# Patient Record
Sex: Male | Born: 1955 | Race: White | Hispanic: No | Marital: Married | Smoking: Never smoker
Health system: Southern US, Community
[De-identification: ages and names within clinical notes are randomized; demographics above are authoritative.]

## PROBLEM LIST (undated history)

## (undated) DIAGNOSIS — Z973 Presence of spectacles and contact lenses: Secondary | ICD-10-CM

## (undated) DIAGNOSIS — F419 Anxiety disorder, unspecified: Secondary | ICD-10-CM

## (undated) DIAGNOSIS — K4090 Unilateral inguinal hernia, without obstruction or gangrene, not specified as recurrent: Secondary | ICD-10-CM

## (undated) DIAGNOSIS — M1710 Unilateral primary osteoarthritis, unspecified knee: Secondary | ICD-10-CM

## (undated) DIAGNOSIS — E785 Hyperlipidemia, unspecified: Secondary | ICD-10-CM

## (undated) DIAGNOSIS — F909 Attention-deficit hyperactivity disorder, unspecified type: Secondary | ICD-10-CM

## (undated) DIAGNOSIS — W540XXA Bitten by dog, initial encounter: Secondary | ICD-10-CM

## (undated) DIAGNOSIS — N400 Enlarged prostate without lower urinary tract symptoms: Secondary | ICD-10-CM

## (undated) DIAGNOSIS — M179 Osteoarthritis of knee, unspecified: Secondary | ICD-10-CM

## (undated) HISTORY — PX: FRACTURE SURGERY: SHX138

---

## 2001-10-23 ENCOUNTER — Ambulatory Visit (HOSPITAL_COMMUNITY): Admission: RE | Admit: 2001-10-23 | Discharge: 2001-10-23 | Payer: Self-pay | Admitting: Orthopedic Surgery

## 2001-10-23 ENCOUNTER — Encounter: Payer: Self-pay | Admitting: Orthopedic Surgery

## 2001-10-24 ENCOUNTER — Encounter: Payer: Self-pay | Admitting: Orthopedic Surgery

## 2001-10-25 ENCOUNTER — Inpatient Hospital Stay (HOSPITAL_COMMUNITY): Admission: RE | Admit: 2001-10-25 | Discharge: 2001-10-27 | Payer: Self-pay | Admitting: Orthopedic Surgery

## 2001-10-25 ENCOUNTER — Encounter: Payer: Self-pay | Admitting: Orthopedic Surgery

## 2012-02-19 ENCOUNTER — Other Ambulatory Visit: Payer: Self-pay | Admitting: Neurosurgery

## 2012-02-19 DIAGNOSIS — M542 Cervicalgia: Secondary | ICD-10-CM

## 2012-02-27 ENCOUNTER — Other Ambulatory Visit: Payer: Self-pay

## 2012-03-12 ENCOUNTER — Ambulatory Visit
Admission: RE | Admit: 2012-03-12 | Discharge: 2012-03-12 | Disposition: A | Discharge status: Home / Self Care | Payer: BC Managed Care – PPO | Source: Ambulatory Visit | Attending: Neurosurgery | Admitting: Neurosurgery

## 2012-03-12 VITALS — BP 93/63 | HR 50

## 2012-03-12 DIAGNOSIS — M542 Cervicalgia: Secondary | ICD-10-CM

## 2012-03-12 MED ORDER — DIAZEPAM 5 MG PO TABS
10.0000 mg | ORAL_TABLET | Freq: Once | ORAL | Status: AC
  Administered 2012-03-12: 10 mg via ORAL

## 2012-03-12 MED ORDER — IOHEXOL 300 MG/ML  SOLN
10.0000 mL | Freq: Once | INTRAMUSCULAR | Status: AC | PRN
  Administered 2012-03-12: 10 mL via INTRAVENOUS

## 2012-06-27 ENCOUNTER — Other Ambulatory Visit: Payer: Self-pay | Admitting: Neurosurgery

## 2012-07-17 ENCOUNTER — Encounter (HOSPITAL_COMMUNITY): Payer: Self-pay | Admitting: Pharmacy Technician

## 2012-07-22 NOTE — Pre-Procedure Instructions (Addendum)
20 Christopher Ross  07/22/2012   Your procedure is scheduled on: Wednesday, November 20th   Report to [facility] Stay [facility] at 6:30 AM.  Call this number if you have problems the morning of surgery: [phone]   Remember:   Do not eat food or drink any liquids:After Midnight Tuesday.    Take these medicines the morning of surgery with A SIP OF WATER: None.     Discontinue aspirin, coumadin, plavix, effient and herbal medications.  Do not take any ibuprofen, naproxen, or aspirin productions.    Do not wear jewelry.  Do not wear lotions, powders, or colognes. You may NOT  wear deodorant.   Men may shave face and neck.   Do not bring valuables to the hospital.  Contacts, dentures or bridgework may not be worn into surgery.   Leave suitcase in the car. After surgery it may be brought to your room.  For patients admitted to the hospital, checkout time is 11:00 AM the day of discharge.   Patients discharged the day of surgery will not be allowed to drive home.   Name and phone number of your driver:    Special Instructions: Shower using CHG 2 nights before surgery and the night before surgery.  If you shower the day of surgery use CHG.  Use special wash - you have one bottle of CHG for all showers.  You should use approximately 1/3 of the bottle for each shower.   Please read over the following fact sheets that you were given: Pain Booklet, Coughing and Deep Breathing, MRSA Information and Surgical Site Infection Prevention

## 2012-07-23 ENCOUNTER — Encounter (HOSPITAL_COMMUNITY): Payer: Self-pay

## 2012-07-23 ENCOUNTER — Encounter (HOSPITAL_COMMUNITY)
Admission: RE | Admit: 2012-07-23 | Discharge: 2012-07-23 | Disposition: A | Discharge status: Home / Self Care | Payer: BC Managed Care – PPO | Source: Ambulatory Visit | Attending: Neurosurgery | Admitting: Neurosurgery

## 2012-07-23 HISTORY — DX: Hyperlipidemia, unspecified: E78.5

## 2012-07-23 LAB — BASIC METABOLIC PANEL
CO2: 29 mEq/L (ref 19–32)
Calcium: 9.6 mg/dL (ref 8.4–10.5)
Chloride: 103 mEq/L (ref 96–112)
Glucose, Bld: 102 mg/dL — ABNORMAL HIGH (ref 70–99)
Potassium: 4.6 mEq/L (ref 3.5–5.1)
Sodium: 141 mEq/L (ref 135–145)

## 2012-07-23 LAB — CBC
Hemoglobin: 14.4 g/dL (ref 13.0–17.0)
MCH: 30.8 pg (ref 26.0–34.0)
RBC: 4.67 MIL/uL (ref 4.22–5.81)

## 2012-07-23 LAB — SURGICAL PCR SCREEN: Staphylococcus aureus: NEGATIVE

## 2012-07-29 MED ORDER — CEFAZOLIN SODIUM-DEXTROSE 2-3 GM-% IV SOLR
2.0000 g | INTRAVENOUS | Status: AC
  Administered 2012-07-30: 2 g via INTRAVENOUS
  Filled 2012-07-29 (×2): qty 50

## 2012-07-30 ENCOUNTER — Ambulatory Visit (HOSPITAL_COMMUNITY): Payer: BC Managed Care – PPO

## 2012-07-30 ENCOUNTER — Encounter (HOSPITAL_COMMUNITY): Payer: Self-pay | Admitting: Anesthesiology

## 2012-07-30 ENCOUNTER — Encounter (HOSPITAL_COMMUNITY)
Admission: RE | Disposition: A | Discharge status: Home / Self Care | Payer: Self-pay | Source: Ambulatory Visit | Attending: Neurosurgery

## 2012-07-30 ENCOUNTER — Ambulatory Visit (HOSPITAL_COMMUNITY)
Admission: RE | Admit: 2012-07-30 | Discharge: 2012-07-31 | Disposition: A | Discharge status: Home / Self Care | Payer: BC Managed Care – PPO | Source: Ambulatory Visit | Attending: Neurosurgery | Admitting: Neurosurgery

## 2012-07-30 ENCOUNTER — Ambulatory Visit (HOSPITAL_COMMUNITY): Payer: BC Managed Care – PPO | Admitting: Anesthesiology

## 2012-07-30 ENCOUNTER — Encounter (HOSPITAL_COMMUNITY): Payer: Self-pay | Admitting: *Deleted

## 2012-07-30 DIAGNOSIS — M4722 Other spondylosis with radiculopathy, cervical region: Secondary | ICD-10-CM

## 2012-07-30 DIAGNOSIS — Z79899 Other long term (current) drug therapy: Secondary | ICD-10-CM | POA: Insufficient documentation

## 2012-07-30 DIAGNOSIS — M4712 Other spondylosis with myelopathy, cervical region: Secondary | ICD-10-CM | POA: Insufficient documentation

## 2012-07-30 DIAGNOSIS — Z01812 Encounter for preprocedural laboratory examination: Secondary | ICD-10-CM | POA: Insufficient documentation

## 2012-07-30 DIAGNOSIS — E785 Hyperlipidemia, unspecified: Secondary | ICD-10-CM | POA: Insufficient documentation

## 2012-07-30 DIAGNOSIS — M5000 Cervical disc disorder with myelopathy, unspecified cervical region: Principal | ICD-10-CM | POA: Insufficient documentation

## 2012-07-30 HISTORY — PX: ANTERIOR CERVICAL DECOMP/DISCECTOMY FUSION: SHX1161

## 2012-07-30 SURGERY — ANTERIOR CERVICAL DECOMPRESSION/DISCECTOMY FUSION 2 LEVELS
Anesthesia: General | Site: Spine Cervical | Wound class: Clean

## 2012-07-30 MED ORDER — VECURONIUM BROMIDE 10 MG IV SOLR
INTRAVENOUS | Status: DC | PRN
  Administered 2012-07-30: 2 mg via INTRAVENOUS
  Administered 2012-07-30 (×3): 1 mg via INTRAVENOUS

## 2012-07-30 MED ORDER — ZOLPIDEM TARTRATE 5 MG PO TABS
5.0000 mg | ORAL_TABLET | Freq: Every evening | ORAL | Status: DC | PRN
  Administered 2012-07-30: 5 mg via ORAL
  Filled 2012-07-30: qty 1

## 2012-07-30 MED ORDER — ACETAMINOPHEN 10 MG/ML IV SOLN
INTRAVENOUS | Status: AC
  Administered 2012-07-30: 1000 mg via INTRAVENOUS
  Filled 2012-07-30: qty 100

## 2012-07-30 MED ORDER — OXYCODONE HCL 5 MG PO TABS: 5.0000 mg | ORAL_TABLET | Freq: Once | ORAL | Status: DC | PRN

## 2012-07-30 MED ORDER — DEXAMETHASONE SODIUM PHOSPHATE 4 MG/ML IJ SOLN: 4.0000 mg | Freq: Four times a day (QID) | INTRAMUSCULAR | Status: AC

## 2012-07-30 MED ORDER — MEPERIDINE HCL 25 MG/ML IJ SOLN
6.2500 mg | INTRAMUSCULAR | Status: DC | PRN
Start: 2012-07-30 — End: 2012-07-30

## 2012-07-30 MED ORDER — THROMBIN 5000 UNITS EX KIT
PACK | CUTANEOUS | Status: DC | PRN
  Administered 2012-07-30 (×4): 5000 [IU] via TOPICAL

## 2012-07-30 MED ORDER — BACITRACIN ZINC 500 UNIT/GM EX OINT
TOPICAL_OINTMENT | CUTANEOUS | Status: DC | PRN
  Administered 2012-07-30: 1 via TOPICAL

## 2012-07-30 MED ORDER — ACETAMINOPHEN 325 MG PO TABS: 650.0000 mg | ORAL_TABLET | ORAL | Status: DC | PRN

## 2012-07-30 MED ORDER — LIDOCAINE HCL 4 % MT SOLN
OROMUCOSAL | Status: DC | PRN
  Administered 2012-07-30: 4 mL via TOPICAL

## 2012-07-30 MED ORDER — GLYCOPYRROLATE 0.2 MG/ML IJ SOLN
INTRAMUSCULAR | Status: DC | PRN
  Administered 2012-07-30: .6 mg via INTRAVENOUS

## 2012-07-30 MED ORDER — ARTIFICIAL TEARS OP OINT
TOPICAL_OINTMENT | OPHTHALMIC | Status: DC | PRN
  Administered 2012-07-30: 1 via OPHTHALMIC

## 2012-07-30 MED ORDER — HEMOSTATIC AGENTS (NO CHARGE) OPTIME
TOPICAL | Status: DC | PRN
  Administered 2012-07-30 (×2): 1 via TOPICAL

## 2012-07-30 MED ORDER — ONDANSETRON HCL 4 MG/2ML IJ SOLN: 4.0000 mg | INTRAMUSCULAR | Status: DC | PRN

## 2012-07-30 MED ORDER — BACITRACIN 50000 UNITS IM SOLR
INTRAMUSCULAR | Status: AC
  Filled 2012-07-30: qty 1

## 2012-07-30 MED ORDER — OXYCODONE HCL 5 MG/5ML PO SOLN: 5.0000 mg | Freq: Once | ORAL | Status: DC | PRN

## 2012-07-30 MED ORDER — MENTHOL 3 MG MT LOZG
1.0000 | LOZENGE | OROMUCOSAL | Status: DC | PRN
  Administered 2012-07-30: 3 mg via ORAL
  Filled 2012-07-30 (×2): qty 9

## 2012-07-30 MED ORDER — DIAZEPAM 5 MG PO TABS: 5.0000 mg | ORAL_TABLET | Freq: Four times a day (QID) | ORAL | Status: DC | PRN

## 2012-07-30 MED ORDER — LIDOCAINE HCL (CARDIAC) 20 MG/ML IV SOLN
INTRAVENOUS | Status: DC | PRN
  Administered 2012-07-30: 100 mg via INTRAVENOUS

## 2012-07-30 MED ORDER — DEXAMETHASONE 4 MG PO TABS
4.0000 mg | ORAL_TABLET | Freq: Four times a day (QID) | ORAL | Status: AC
  Administered 2012-07-30 – 2012-07-31 (×2): 4 mg via ORAL
  Filled 2012-07-30 (×2): qty 1

## 2012-07-30 MED ORDER — PHENYLEPHRINE HCL 10 MG/ML IJ SOLN
10.0000 mg | INTRAVENOUS | Status: DC | PRN
  Administered 2012-07-30: 10 ug/min via INTRAVENOUS

## 2012-07-30 MED ORDER — ONDANSETRON HCL 4 MG/2ML IJ SOLN: 4.0000 mg | Freq: Once | INTRAMUSCULAR | Status: DC | PRN

## 2012-07-30 MED ORDER — HYDROCODONE-ACETAMINOPHEN 5-325 MG PO TABS
1.0000 | ORAL_TABLET | ORAL | Status: DC | PRN
  Administered 2012-07-31: 1 via ORAL
  Filled 2012-07-30: qty 1

## 2012-07-30 MED ORDER — MIDAZOLAM HCL 5 MG/5ML IJ SOLN
INTRAMUSCULAR | Status: DC | PRN
  Administered 2012-07-30: 2 mg via INTRAVENOUS

## 2012-07-30 MED ORDER — MORPHINE SULFATE 2 MG/ML IJ SOLN: 1.0000 mg | INTRAMUSCULAR | Status: DC | PRN

## 2012-07-30 MED ORDER — 0.9 % SODIUM CHLORIDE (POUR BTL) OPTIME
TOPICAL | Status: DC | PRN
  Administered 2012-07-30: 1000 mL

## 2012-07-30 MED ORDER — HYDROMORPHONE HCL PF 1 MG/ML IJ SOLN
0.2500 mg | INTRAMUSCULAR | Status: DC | PRN
  Administered 2012-07-30: 0.5 mg via INTRAVENOUS

## 2012-07-30 MED ORDER — SODIUM CHLORIDE 0.9 % IR SOLN
Status: DC | PRN
  Administered 2012-07-30: 09:00:00

## 2012-07-30 MED ORDER — OXYCODONE-ACETAMINOPHEN 5-325 MG PO TABS: 1.0000 | ORAL_TABLET | ORAL | Status: DC | PRN

## 2012-07-30 MED ORDER — EPHEDRINE SULFATE 50 MG/ML IJ SOLN
INTRAMUSCULAR | Status: DC | PRN
  Administered 2012-07-30: 5 mg via INTRAVENOUS

## 2012-07-30 MED ORDER — CEFAZOLIN SODIUM-DEXTROSE 2-3 GM-% IV SOLR
2.0000 g | Freq: Three times a day (TID) | INTRAVENOUS | Status: AC
  Administered 2012-07-30 – 2012-07-31 (×2): 2 g via INTRAVENOUS
  Filled 2012-07-30 (×2): qty 50

## 2012-07-30 MED ORDER — ROCURONIUM BROMIDE 100 MG/10ML IV SOLN
INTRAVENOUS | Status: DC | PRN
  Administered 2012-07-30: 50 mg via INTRAVENOUS

## 2012-07-30 MED ORDER — SIMVASTATIN 5 MG PO TABS
2.5000 mg | ORAL_TABLET | Freq: Every day | ORAL | Status: DC
  Administered 2012-07-30: 2.5 mg via ORAL
  Filled 2012-07-30 (×2): qty 1

## 2012-07-30 MED ORDER — ACETAMINOPHEN 650 MG RE SUPP: 650.0000 mg | RECTAL | Status: DC | PRN

## 2012-07-30 MED ORDER — PROPOFOL 10 MG/ML IV BOLUS
INTRAVENOUS | Status: DC | PRN
  Administered 2012-07-30: 180 mg via INTRAVENOUS

## 2012-07-30 MED ORDER — BUPIVACAINE-EPINEPHRINE PF 0.5-1:200000 % IJ SOLN
INTRAMUSCULAR | Status: DC | PRN
  Administered 2012-07-30: 10 mL

## 2012-07-30 MED ORDER — PHENOL 1.4 % MT LIQD
1.0000 | OROMUCOSAL | Status: DC | PRN
  Filled 2012-07-30: qty 177

## 2012-07-30 MED ORDER — LACTATED RINGERS IV SOLN
INTRAVENOUS | Status: DC | PRN
  Administered 2012-07-30 (×2): via INTRAVENOUS

## 2012-07-30 MED ORDER — DEXAMETHASONE SODIUM PHOSPHATE 4 MG/ML IJ SOLN
8.0000 mg | Freq: Once | INTRAMUSCULAR | Status: AC
  Administered 2012-07-30: 8 mg via INTRAVENOUS
  Filled 2012-07-30: qty 2

## 2012-07-30 MED ORDER — SODIUM CHLORIDE 0.9 % IV SOLN
INTRAVENOUS | Status: AC
  Filled 2012-07-30: qty 500

## 2012-07-30 MED ORDER — ONDANSETRON HCL 4 MG/2ML IJ SOLN
INTRAMUSCULAR | Status: DC | PRN
  Administered 2012-07-30: 4 mg via INTRAVENOUS

## 2012-07-30 MED ORDER — NEOSTIGMINE METHYLSULFATE 1 MG/ML IJ SOLN
INTRAMUSCULAR | Status: DC | PRN
  Administered 2012-07-30: 4 mg via INTRAVENOUS

## 2012-07-30 MED ORDER — FENTANYL CITRATE 0.05 MG/ML IJ SOLN
INTRAMUSCULAR | Status: DC | PRN
  Administered 2012-07-30 (×5): 50 ug via INTRAVENOUS

## 2012-07-30 MED ORDER — LACTATED RINGERS IV SOLN: INTRAVENOUS | Status: DC

## 2012-07-30 MED ORDER — PHENYLEPHRINE HCL 10 MG/ML IJ SOLN
INTRAMUSCULAR | Status: DC | PRN
  Administered 2012-07-30: 80 ug via INTRAVENOUS
  Administered 2012-07-30: 40 ug via INTRAVENOUS

## 2012-07-30 MED ORDER — HYDROMORPHONE HCL PF 1 MG/ML IJ SOLN
INTRAMUSCULAR | Status: AC
  Filled 2012-07-30: qty 1

## 2012-07-30 MED ORDER — DOCUSATE SODIUM 100 MG PO CAPS
100.0000 mg | ORAL_CAPSULE | Freq: Two times a day (BID) | ORAL | Status: DC
  Administered 2012-07-30: 100 mg via ORAL
  Filled 2012-07-30: qty 1

## 2012-07-30 SURGICAL SUPPLY — 61 items
APL SKNCLS STERI-STRIP NONHPOA (GAUZE/BANDAGES/DRESSINGS) ×1
BAG DECANTER FOR FLEXI CONT (MISCELLANEOUS) ×2 IMPLANT
BENZOIN TINCTURE PRP APPL 2/3 (GAUZE/BANDAGES/DRESSINGS) ×3 IMPLANT
BLADE SURG 15 STRL LF DISP TIS (BLADE) ×1 IMPLANT
BLADE SURG 15 STRL SS (BLADE) ×2
BLADE ULTRA TIP 2M (BLADE) ×2 IMPLANT
BRUSH SCRUB EZ PLAIN DRY (MISCELLANEOUS) ×2 IMPLANT
BUR BARREL STRAIGHT FLUTE 4.0 (BURR) ×2 IMPLANT
BUR MATCHSTICK NEURO 3.0 LAGG (BURR) ×2 IMPLANT
CANISTER SUCTION 2500CC (MISCELLANEOUS) ×2 IMPLANT
CLOTH BEACON ORANGE TIMEOUT ST (SAFETY) ×2 IMPLANT
CONT SPEC 4OZ CLIKSEAL STRL BL (MISCELLANEOUS) ×2 IMPLANT
COVER MAYO STAND STRL (DRAPES) ×2 IMPLANT
DRAPE LAPAROTOMY 100X72 PEDS (DRAPES) ×2 IMPLANT
DRAPE MICROSCOPE LEICA (MISCELLANEOUS) IMPLANT
DRAPE POUCH INSTRU U-SHP 10X18 (DRAPES) ×2 IMPLANT
DRAPE SURG 17X23 STRL (DRAPES) ×4 IMPLANT
ELECT REM PT RETURN 9FT ADLT (ELECTROSURGICAL) ×2
ELECTRODE REM PT RTRN 9FT ADLT (ELECTROSURGICAL) ×1 IMPLANT
GAUZE SPONGE 4X4 16PLY XRAY LF (GAUZE/BANDAGES/DRESSINGS) IMPLANT
GLOVE BIO SURGEON STRL SZ8.5 (GLOVE) ×2 IMPLANT
GLOVE BIOGEL PI IND STRL 8.5 (GLOVE) IMPLANT
GLOVE BIOGEL PI INDICATOR 8.5 (GLOVE) ×2
GLOVE ECLIPSE 6.5 STRL STRAW (GLOVE) ×1 IMPLANT
GLOVE EXAM NITRILE LRG STRL (GLOVE) IMPLANT
GLOVE EXAM NITRILE MD LF STRL (GLOVE) IMPLANT
GLOVE EXAM NITRILE XL STR (GLOVE) IMPLANT
GLOVE EXAM NITRILE XS STR PU (GLOVE) IMPLANT
GLOVE SS BIOGEL STRL SZ 8 (GLOVE) ×1 IMPLANT
GLOVE SUPERSENSE BIOGEL SZ 8 (GLOVE) ×1
GLOVE SURG SS PI 8.0 STRL IVOR (GLOVE) ×3 IMPLANT
GOWN BRE IMP SLV AUR LG STRL (GOWN DISPOSABLE) ×1 IMPLANT
GOWN BRE IMP SLV AUR XL STRL (GOWN DISPOSABLE) ×1 IMPLANT
GOWN STRL REIN 2XL LVL4 (GOWN DISPOSABLE) ×1 IMPLANT
KIT BASIN OR (CUSTOM PROCEDURE TRAY) ×2 IMPLANT
KIT ROOM TURNOVER OR (KITS) ×2 IMPLANT
MARKER SKIN DUAL TIP RULER LAB (MISCELLANEOUS) ×2 IMPLANT
NDL SPNL 18GX3.5 QUINCKE PK (NEEDLE) ×1 IMPLANT
NEEDLE HYPO 22GX1.5 SAFETY (NEEDLE) ×2 IMPLANT
NEEDLE SPNL 18GX3.5 QUINCKE PK (NEEDLE) ×2 IMPLANT
NS IRRIG 1000ML POUR BTL (IV SOLUTION) ×2 IMPLANT
PACK LAMINECTOMY NEURO (CUSTOM PROCEDURE TRAY) ×2 IMPLANT
PATTIES SURGICAL .5 X.5 (GAUZE/BANDAGES/DRESSINGS) ×1 IMPLANT
PEEK VISTA 14X14X7MM (Peek) ×2 IMPLANT
PIN DISTRACTION 14MM (PIN) ×4 IMPLANT
PLATE ANT CERV XTEND 2 LV 32 (Plate) ×1 IMPLANT
PUTTY 5ML ACTIFUSE ABX (Putty) ×1 IMPLANT
RUBBERBAND STERILE (MISCELLANEOUS) IMPLANT
SCREW XTD VAR 4.2 SELF TAP (Screw) ×6 IMPLANT
SPONGE GAUZE 4X4 12PLY (GAUZE/BANDAGES/DRESSINGS) ×2 IMPLANT
SPONGE INTESTINAL PEANUT (DISPOSABLE) ×4 IMPLANT
SPONGE SURGIFOAM ABS GEL SZ50 (HEMOSTASIS) ×3 IMPLANT
STRIP CLOSURE SKIN 1/2X4 (GAUZE/BANDAGES/DRESSINGS) ×2 IMPLANT
SUT VIC AB 0 CT1 27 (SUTURE) ×2
SUT VIC AB 0 CT1 27XBRD ANTBC (SUTURE) ×1 IMPLANT
SUT VIC AB 3-0 SH 8-18 (SUTURE) ×3 IMPLANT
SYR 20ML ECCENTRIC (SYRINGE) ×2 IMPLANT
TAPE CLOTH SURG 4X10 WHT LF (GAUZE/BANDAGES/DRESSINGS) ×1 IMPLANT
TOWEL OR 17X24 6PK STRL BLUE (TOWEL DISPOSABLE) ×2 IMPLANT
TOWEL OR 17X26 10 PK STRL BLUE (TOWEL DISPOSABLE) ×2 IMPLANT
WATER STERILE IRR 1000ML POUR (IV SOLUTION) ×2 IMPLANT

## 2012-07-30 NOTE — Progress Notes (Signed)
Patient ID: Christopher Ross, male   DOB: 1955-10-24, 56 y.o.   MRN: [id number] Subjective:  The patient is alert and pleasant. He looks well. He is in no apparent distress.  Objective: Vital signs in last 24 hours: Temp:  [97.5 F (36.4 C)-97.7 F (36.5 C)] 97.7 F (36.5 C) (11/20 1237) Pulse Rate:  [58-87] 73  (11/20 1237) Resp:  [10-51] 16  (11/20 1237) BP: (105-129)/(60-75) 129/69 mmHg (11/20 1237) SpO2:  [94 %-98 %] 94 % (11/20 1237)  Intake/Output from previous day:   Intake/Output this shift: Total I/O In: 1600 [I.V.:1600] Out: 150 [Blood:150]  Physical exam the patient is alert and oriented. His strength is normal his bilateral deltoid bicep and lower extremities. His dressing is clean and dry. There is no evidence of hematoma or shift.  Lab Results: No results found for this basename: WBC:2,HGB:2,HCT:2,PLT:2 in the last 72 hours BMET No results found for this basename: NA:2,K:2,CL:2,CO2:2,GLUCOSE:2,BUN:2,CREATININE:2,CALCIUM:2 in the last 72 hours  Studies/Results: Dg Cervical Spine 2-3 Views  07/30/2012  *RADIOLOGY REPORT*  Clinical Data: Neck pain.  CERVICAL SPINE - 2-3 VIEW  Comparison: Multiple priors.  Findings: Film #1 demonstrates a needle at C5-C6.  Film #2 demonstrates plate and screws status post C4-C6 ACDF.  Satisfactory position and alignment.  IMPRESSION: As above.   Original Report Authenticated By: Davonna Belling, M.D.     Assessment/Plan: The patient is doing well.  LOS: 0 days     Perseus Westall D 07/30/2012, 3:41 PM

## 2012-07-30 NOTE — Anesthesia Preprocedure Evaluation (Addendum)
Anesthesia Evaluation  Patient identified by MRN, date of birth, ID band Patient awake    Reviewed: Allergy & Precautions, H&P , NPO status , Patient's Chart, lab work & pertinent test results, reviewed documented beta blocker date and time   Airway Mallampati: II TM Distance: >3 FB Neck ROM: Full    Dental  (+) Teeth Intact and Dental Advisory Given   Pulmonary          Cardiovascular Rhythm:Regular     Neuro/Psych    GI/Hepatic   Endo/Other    Renal/GU      Musculoskeletal   Abdominal   Peds  Hematology   Anesthesia Other Findings   Reproductive/Obstetrics                           Anesthesia Physical Anesthesia Plan  ASA: II  Anesthesia Plan: General   Post-op Pain Management:    Induction: Intravenous  Airway Management Planned: Oral ETT  Additional Equipment:   Intra-op Plan:   Post-operative Plan: Extubation in OR  Informed Consent: I have reviewed the patients History and Physical, chart, labs and discussed the procedure including the risks, benefits and alternatives for the proposed anesthesia with the patient or authorized representative who has indicated his/her understanding and acceptance.   Dental advisory given  Plan Discussed with: CRNA and Surgeon  Anesthesia Plan Comments:        Anesthesia Quick Evaluation

## 2012-07-30 NOTE — Transfer of Care (Signed)
Immediate Anesthesia Transfer of Care Note  Patient: Christopher Ross  Procedure(s) Performed: Procedure(s) (LRB) with comments: ANTERIOR CERVICAL DECOMPRESSION/DISCECTOMY FUSION 2 LEVELS (N/A) - Cervical four-five,Cervical five-six anterior cervical decompression with fusion interbody prothesis plating and bonegraft  Patient Location: PACU  Anesthesia Type:General  Level of Consciousness: awake, alert  and oriented  Airway & Oxygen Therapy: Patient Spontanous Breathing and Patient connected to nasal cannula oxygen  Post-op Assessment: Report given to PACU RN, Post -op Vital signs reviewed and stable and Patient moving all extremities  Post vital signs: Reviewed and stable  Complications: No apparent anesthesia complications

## 2012-07-30 NOTE — Progress Notes (Signed)
Patient reports that after application of CHF wipes that he started to itch around his neck and that he scratched his neck some. Patient reports that same in now resolved. Neck cleaned with water. Area noted to be red. Hives noted. Patient denies other symptoms. Neuro OR desk notified. Patient told to call for further problems.

## 2012-07-30 NOTE — Anesthesia Postprocedure Evaluation (Signed)
Anesthesia Post Note  Patient: Christopher Ross  Procedure(s) Performed: Procedure(s) (LRB): ANTERIOR CERVICAL DECOMPRESSION/DISCECTOMY FUSION 2 LEVELS (N/A)  Anesthesia type: general  Patient location: PACU  Post pain: Pain level controlled  Post assessment: Patient's Cardiovascular Status Stable  Last Vitals:  Filed Vitals:   07/30/12 1207  BP: 109/64  Pulse: 58  Temp:   Resp: 10    Post vital signs: Reviewed and stable  Level of consciousness: sedated  Complications: No apparent anesthesia complications

## 2012-07-30 NOTE — Progress Notes (Signed)
Patient ID: Christopher Ross, male   DOB: 06-10-1956, 56 y.o.   MRN: [id number] Subjective:  The patient is alert and pleasant. He looks well.  Objective: Vital signs in last 24 hours: Temp:  [97.5 F (36.4 C)-97.6 F (36.4 C)] 97.6 F (36.4 C) (11/20 1135) Pulse Rate:  [62-83] 76  (11/20 1200) Resp:  [11-18] 18  (11/20 1200) BP: (113-123)/(64-75) 113/64 mmHg (11/20 1135) SpO2:  [94 %-98 %] 98 % (11/20 1200)  Intake/Output from previous day:   Intake/Output this shift: Total I/O In: 1500 [I.V.:1500] Out: 150 [Blood:150]  Physical exam the patient is alert and oriented. He is moving all 4 extremities well. He is 5 over 5 bilateral deltoid strength. His dressing is clean and dry. There is no evidence of hematoma or shift.  Lab Results: No results found for this basename: WBC:2,HGB:2,HCT:2,PLT:2 in the last 72 hours BMET No results found for this basename: NA:2,K:2,CL:2,CO2:2,GLUCOSE:2,BUN:2,CREATININE:2,CALCIUM:2 in the last 72 hours  Studies/Results: Dg Cervical Spine 2-3 Views  07/30/2012  *RADIOLOGY REPORT*  Clinical Data: Neck pain.  CERVICAL SPINE - 2-3 VIEW  Comparison: Multiple priors.  Findings: Film #1 demonstrates a needle at C5-C6.  Film #2 demonstrates plate and screws status post C4-C6 ACDF.  Satisfactory position and alignment.  IMPRESSION: As above.   Original Report Authenticated By: Davonna Belling, M.D.     Assessment/Plan: The patient is doing well.  LOS: 0 days     Caylin Nass D 07/30/2012, 12:03 PM

## 2012-07-30 NOTE — H&P (Signed)
Subjective: The patient is a 56 year old white male who complains of neck and arm pain. He has failed medical management was worked up with a cervical MRI and cervical mild CT. This demonstrated spondylosis and stenosis at C4-5 and C5-6. We discussed the various treatment options including surgery. The patient has weighed the risks, benefits, and alternatives surgery and decided proceed with C4-5 and C5-6 anterior cervicectomy fusion and plating .  Past Medical History  Diagnosis Date  . Hyperlipidemia     Past Surgical History  Procedure Date  . Fracture surgery     broken left arm, repair    No Known Allergies  History  Substance Use Topics  . Smoking status: Never Smoker   . Smokeless tobacco: Never Used  . Alcohol Use: Yes     Comment: occasional     History reviewed. No pertinent family history. Prior to Admission medications   Medication Sig Start Date End Date Taking? Authorizing Provider  fish oil-omega-3 fatty acids 1000 MG capsule Take 1 g by mouth 4 (four) times daily.   Yes Historical Provider, MD  simvastatin (ZOCOR) 5 MG tablet Take 2.5 mg by mouth at bedtime.   Yes Historical Provider, MD  zolpidem (AMBIEN) 5 MG tablet Take 2.5 mg by mouth at bedtime.   Yes Historical Provider, MD     Review of Systems  Positive ROS:  As above  All other systems have been reviewed and were otherwise negative with the exception of those mentioned in the HPI and as above.  Objective: Vital signs in last 24 hours: Temp:  [97.5 F (36.4 C)] 97.5 F (36.4 C) (11/20 0646) Pulse Rate:  [62] 62  (11/20 0646) Resp:  [18] 18  (11/20 0646) BP: (123)/(75) 123/75 mmHg (11/20 0646) SpO2:  [94 %] 94 % (11/20 0646)  General Appearance: Alert, cooperative, no distress, appears stated age Head: Normocephalic, without obvious abnormality, atraumatic Eyes: PERRL, conjunctiva/corneas clear, EOM's intact, fundi benign, both eyes      Ears: Normal TM's and external ear canals, both  ears Throat: Lips, mucosa, and tongue normal; teeth and gums normal Neck: Supple, symmetrical, trachea midline, no adenopathy; thyroid: No enlargement/tenderness/nodules; no carotid bruit or JVD Back: Symmetric, no curvature, ROM normal, no CVA tenderness Lungs: Clear to auscultation bilaterally, respirations unlabored Heart: Regular rate and rhythm, S1 and S2 normal, no murmur, rub or gallop Abdomen: Soft, non-tender, bowel sounds active all four quadrants, no masses, no organomegaly Extremities: Extremities normal, atraumatic, no cyanosis or edema Pulses: 2+ and symmetric all extremities Skin: Skin color, texture, turgor normal, no rashes or lesions  NEUROLOGIC:   Mental status: alert and oriented, no aphasia, good attention span, Fund of knowledge/ memory ok Motor Exam - grossly normal Sensory Exam - grossly normal Reflexes:  Coordination - grossly normal Gait - grossly normal Balance - grossly normal Cranial Nerves: I: smell Not tested  II: visual acuity  OS: Normal    OD: Normal   II: visual fields Full to confrontation  II: pupils Equal, round, reactive to light  III,VII: ptosis None  III,IV,VI: extraocular muscles  Full ROM  V: mastication Normal  V: facial light touch sensation  Normal  V,VII: corneal reflex  Present  VII: facial muscle function - upper  Normal  VII: facial muscle function - lower Normal  VIII: hearing Not tested  IX: soft palate elevation  Normal  IX,X: gag reflex Present  XI: trapezius strength  5/5  XI: sternocleidomastoid strength 5/5  XI: neck flexion strength  5/5  XII: tongue strength  Normal    Data Review Lab Results  Component Value Date   WBC 9.0 07/23/2012   HGB 14.4 07/23/2012   HCT 41.9 07/23/2012   MCV 89.7 07/23/2012   PLT 187 07/23/2012   Lab Results  Component Value Date   NA 141 07/23/2012   K 4.6 07/23/2012   CL 103 07/23/2012   CO2 29 07/23/2012   BUN 18 07/23/2012   CREATININE 1.04 07/23/2012   GLUCOSE 102*  07/23/2012   No results found for this basename: INR, PROTIME    Assessment/Plan: 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 I reviewed his imaging studies with them and pointed out the abnormalities. We have discussed the various treatment options including surgery. I described the surgical option of a C4-5 and C5-6 anterior cervicectomy, fusion, and plating. I described the surgery to them. I've shown him surgical models. We have discussed the risks, benefits, alternatives, and likelihood of achieving our goals with surgery. I've answered all the patient's questions. He was to proceed with surgery.   Javien Tesch D 07/30/2012 8:29 AM

## 2012-07-30 NOTE — Op Note (Signed)
Brief history: The patient is a 56 year old white male complains of chronic neck shoulder and arm pain consistent with a cervical radiculopathy. He has failed medical management and was worked up with a cervical MRI, cervical myelo CT and NCV EMGs. The patient has multilevel spondylosis most prominent at C4-5 and C5-6. We discussed the various treatment options including a 2 level intracervical discectomy fusion and plating. The patient has weighed the risks, benefits, and alternatives surgery and decided proceed with the operation.  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 Delma Officer  Asst.: Dr. Barbaraann Barthel  Anesthesia: Gen. endotracheal  Estimated blood loss: 125 cc  Drains: None  Complications: None  Description of procedure: The patient was brought to the operating room by the anesthesia team. General endotracheal anesthesia was induced. A roll was placed under the patient's shoulders to keep the neck in the neutral position. The patient's anterior cervical region was then prepared with Betadine scrub and Betadine solution. Sterile drapes were applied.  The area to be incised was then injected with Marcaine with epinephrine solution. I then used a scalpel to make a transverse incision in the patient's left anterior neck. I used the Metzenbaum scissors to divide the platysmal muscle and then to dissect medial to the sternocleidomastoid muscle, jugular vein, and carotid artery. I carefully dissected down towards the anterior cervical  spine identifying the esophagus and retracting it medially. Then using Kitner swabs to clear soft tissue from the anterior cervical spine. We then inserted a bent spinal needle into the upper exposed intervertebral disc space. We then obtained intraoperative radiographs confirm our location.  I then used electrocautery to detach the medial border of the longus colli muscle bilaterally from the C4-5 and C5-6 intervertebral disc spaces. I then inserted the Caspar self-retaining retractor underneath the longus colli muscle bilaterally to provide exposure.  We then incised the intervertebral disc at C4-5. We then performed a partial intervertebral discectomy with a pituitary forceps and the Karlin curettes. I then inserted distraction screws into the vertebral bodies at C4 and C5. We then distracted the interspace. We then used the high-speed drill to decorticate the vertebral endplates at C4-5, to drill away the remainder of the intervertebral disc, to drill away some posterior spondylosis, and to thin out the posterior longitudinal ligament. I then incised ligament with the arachnoid knife. We then removed the ligament with a Kerrison punches undercutting the vertebral endplates and decompressing the thecal sac. We then performed foraminotomies about the bilateral C5 nerve roots. This completed the decompression at this level.  We then repeated this procedure in an analogous fashion at C5-C6, decompressing the thecal sac and the bilateral C6 nerve roots.  We now turned our to attention to the interbody fusion. We used the trial spacers to determine the appropriate size for the interbody prosthesis. We then pre-filled prosthesis with a combination of local morcellized autograft bone that we obtained during decompression as well as Actifuse bone graft extender. We then inserted the prosthesis into the distracted interspace at C4-5 and C5-6. We then removed the distraction screws. There was a good snug fit of the  prosthesis in the interspace.    Having  completed the fusion we now turned attention to the anterior spinal instrumentation. We used the high-speed drill to drill away some anterior spondylosis at the disc spaces so that the plate lay down flat. We selected the appropriate length titanium anterior cervical plate. We laid it along the anterior aspect of the vertebral bodies from C4-C6. We then drilled 14 mm holes at C4, C5 and C6. We then secured the plate to the vertebral bodies by placing two 14 mm self-tapping screws at C4, C5 and C6. We then obtained intraoperative radiograph. The demonstrating good position of the instrumentation. We therefore secured the screws the plate the locking each cam. This completed the instrumentation.  We then obtained hemostasis using bipolar electrocautery. We irrigated the wound out with bacitracin solution. We then removed the retractor. We inspected the esophagus for any damage. There was none apparent. We then reapproximated patient's platysmal muscle with interrupted 3-0 Vicryl suture. We then reapproximated the subcutaneous tissue with interrupted 3-0 Vicryl suture. The skin was reapproximated with Steri-Strips and benzoin. The wound was then covered with bacitracin ointment. A sterile dressing was applied. The drapes were removed. Patient was subsequently extubated by the anesthesia team and transported to the post anesthesia care unit in stable condition. All sponge instrument and needle counts were reportedly correct at the end of this case.

## 2012-07-30 NOTE — Anesthesia Procedure Notes (Addendum)
Procedure Name: Intubation Date/Time: 07/30/2012 8:52 AM Performed by: Luster Landsberg Pre-anesthesia Checklist: Patient identified, Emergency Drugs available, Suction available and Patient being monitored Patient Re-evaluated:Patient Re-evaluated prior to inductionOxygen Delivery Method: Circle system utilized Preoxygenation: Pre-oxygenation with 100% oxygen Intubation Type: IV induction Ventilation: Oral airway inserted - appropriate to patient size and Mask ventilation without difficulty Laryngoscope Size: Mac and 3 Grade View: Grade I Tube type: Oral Tube size: 7.5 mm Number of attempts: 1 Airway Equipment and Method: Stylet and LTA kit utilized Placement Confirmation: ETT inserted through vocal cords under direct vision,  positive ETCO2 and breath sounds checked- equal and bilateral Secured at: 23 cm Tube secured with: Tape Dental Injury: Teeth and Oropharynx as per pre-operative assessment

## 2012-07-31 ENCOUNTER — Encounter (HOSPITAL_COMMUNITY): Payer: Self-pay | Admitting: Neurosurgery

## 2012-07-31 MED ORDER — DSS 100 MG PO CAPS: 100.0000 mg | ORAL_CAPSULE | Freq: Two times a day (BID) | ORAL | Status: DC

## 2012-07-31 MED ORDER — DIAZEPAM 5 MG PO TABS: 5.0000 mg | ORAL_TABLET | Freq: Four times a day (QID) | ORAL | Status: DC | PRN

## 2012-07-31 MED ORDER — OXYCODONE-ACETAMINOPHEN 10-325 MG PO TABS: 1.0000 | ORAL_TABLET | ORAL | Status: DC | PRN

## 2012-07-31 NOTE — Discharge Summary (Signed)
  Physician Discharge Summary  Patient ID: Christopher Ross MRN: [id number] DOB/AGE: [birth year] 56 y.o.  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 *    Discharged Condition: good  Hospital Course: I admitted the patient to [facility] 07/30/2012. On that day I performed a C4-5 and C5-6 intracervical discectomy, fusion, and plating. The surgery were well.  The patient's postoperative course was unremarkable. On postop day #1 the patient requested discharge to home. The patient was given oral and written discharge instructions. All his questions were answered.  Consults: None Significant Diagnostic Studies: None Treatments: C4-5 and C5-6 anterior cervical discectomy, fusion, and plating. Discharge Exam: Blood pressure 110/68, pulse 72, temperature 97.9 F (36.6 C), temperature source Oral, resp. rate 20, SpO2 96.00%. Patient is alert and oriented. He looks well. His strength is normal his bilateral deltoid and biceps as well as lower extremities. His dressing is clean and dry without evidence of hematoma or shift.  Disposition: Home  Discharge Orders    Future Orders Please Complete By Expires   Diet - low sodium heart healthy      Increase activity slowly      Discharge instructions      Comments:   Call [phone] for a followup appointment.   Remove dressing in 48 hours      Call MD for:  temperature >100.4      Call MD for:  persistant nausea and vomiting      Call MD for:  severe uncontrolled pain      Call MD for:  redness, tenderness, or signs of infection (pain, swelling, redness, odor or green/yellow discharge around incision site)      Call MD for:  difficulty breathing, headache or visual disturbances      Call MD for:  hives      Call MD for:   persistant dizziness or light-headedness      Call MD for:  extreme fatigue          Medication List     As of 07/31/2012 10:42 AM    TAKE these medications         diazepam 5 MG tablet   Commonly known as: VALIUM   Take 1 tablet (5 mg total) by mouth every 6 (six) hours as needed.      DSS 100 MG Caps   Take 100 mg by mouth 2 (two) times daily.      fish oil-omega-3 fatty acids 1000 MG capsule   Take 1 g by mouth 4 (four) times daily.      oxyCODONE-acetaminophen 10-325 MG per tablet   Commonly known as: PERCOCET   Take 1 tablet by mouth every 4 (four) hours as needed for pain.      simvastatin 5 MG tablet   Commonly known as: ZOCOR   Take 2.5 mg by mouth at bedtime.      zolpidem 5 MG tablet   Commonly known as: AMBIEN   Take 2.5 mg by mouth at bedtime.         SignedCristi Loron 07/31/2012, 10:42 AM

## 2012-08-05 ENCOUNTER — Encounter (HOSPITAL_COMMUNITY): Payer: Self-pay

## 2013-07-26 IMAGING — RF DG MYELOGRAM CERVICAL
10 series · 10 of 10 positions shown · IV contrast (omnipaque)
Comparison: none

CLINICAL DATA: Bilateral shoulder pain and numbness.

CERVICAL MYELOGRAM
CT CERVICAL SPINE WITH INTRATHECAL CONTRAST
TECHNIQUE: An appropriate entry site was determined under
fluoroscopy. Operator donned sterile gloves and mask. Skin site was
marked, prepped with Betadine, and draped in usual sterile fashion,
and infiltrated locally with 1% lidocaine. A 22 gauge spinal needle
was  advanced into the thecal sac at L3 from the right
posterolateral approach. Clear colorless CSF returned. 10 ml
Omnipaque 300 were administered intrathecally for cervical
myelography, followed by axial CT scanning of the cervical spine.
Coronal and sagittal reconstructions were generated.

[Series 1: (hospital) · 1 of 1 slices shown (1 of 2)]
[im 1/1]
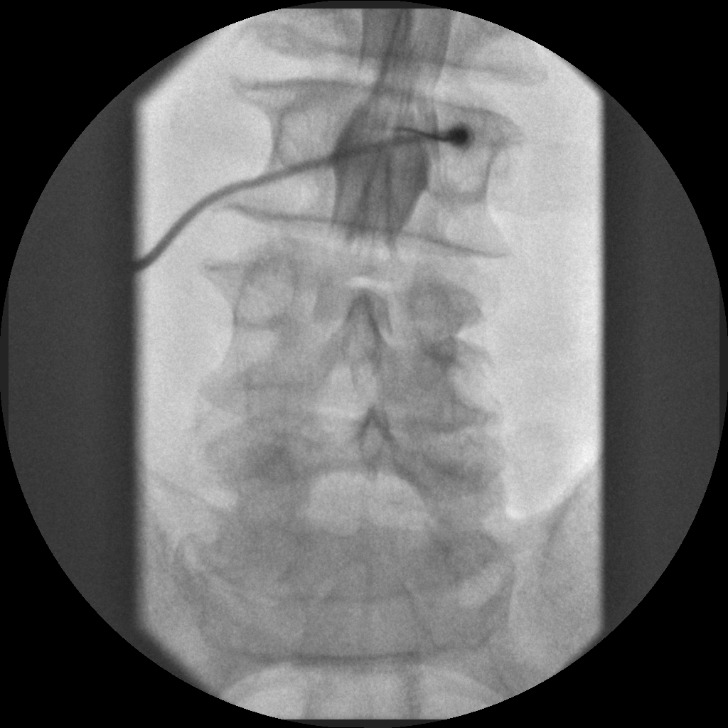

[Series 2: myelogram  white · 1 of 1 slices shown (1 of 8)]
[im 1/1]
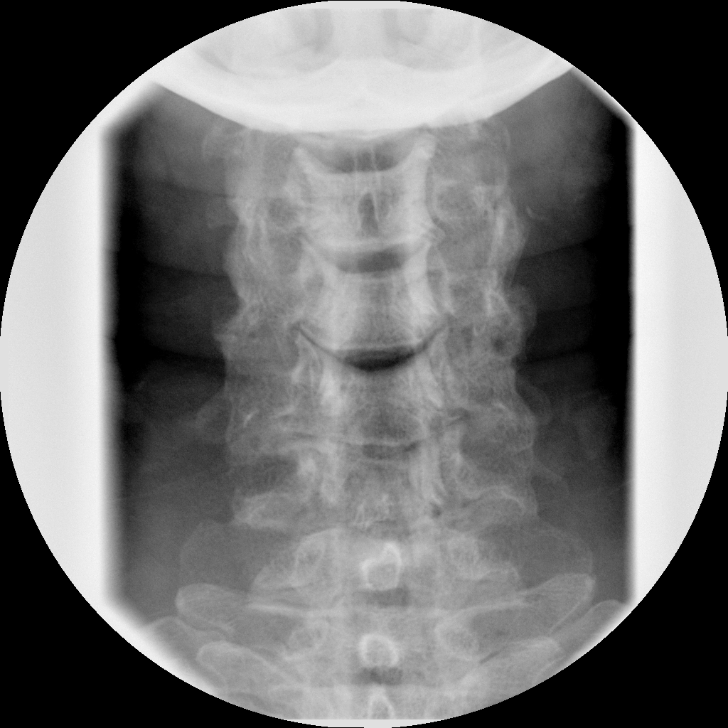

[Series 3: myelogram  white · 1 of 1 slices shown (2 of 8)]
[im 1/1]
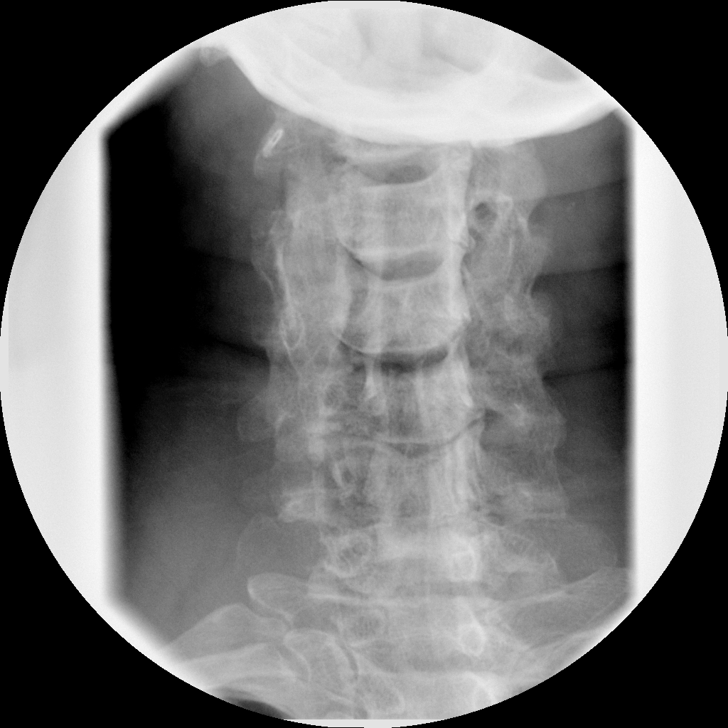

[Series 4: myelogram  white · 1 of 1 slices shown (3 of 8)]
[im 1/1]
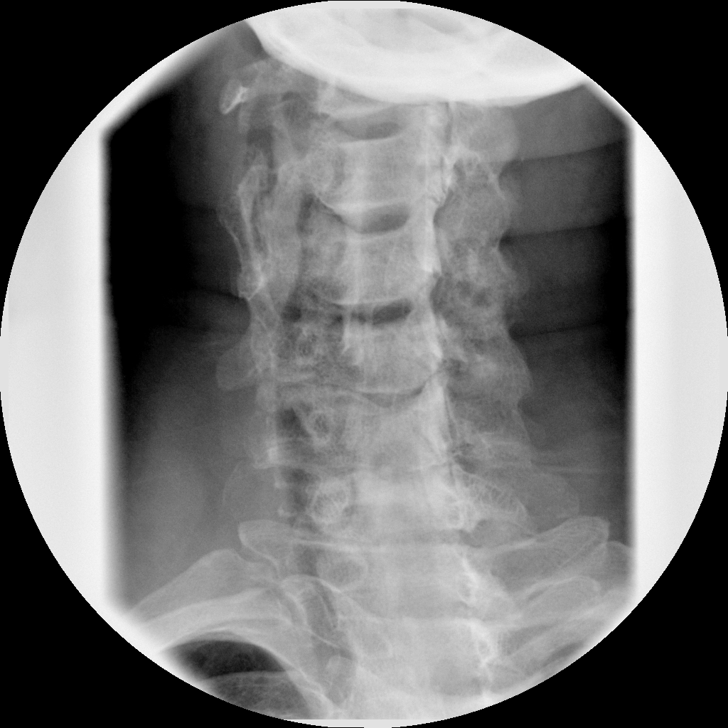

[Series 5: myelogram  white · 1 of 1 slices shown (4 of 8)]
[im 1/1]
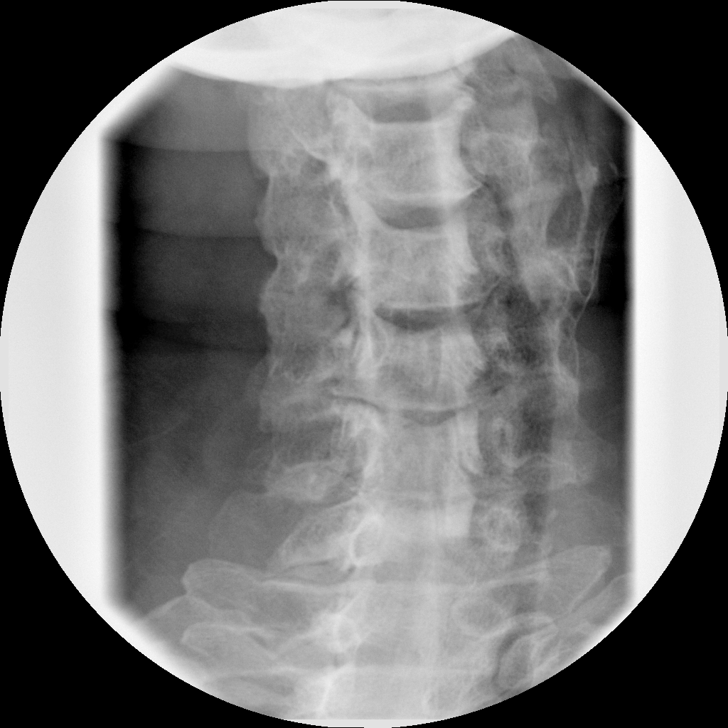

[Series 6: myelogram  white · 1 of 1 slices shown (5 of 8)]
[im 1/1]
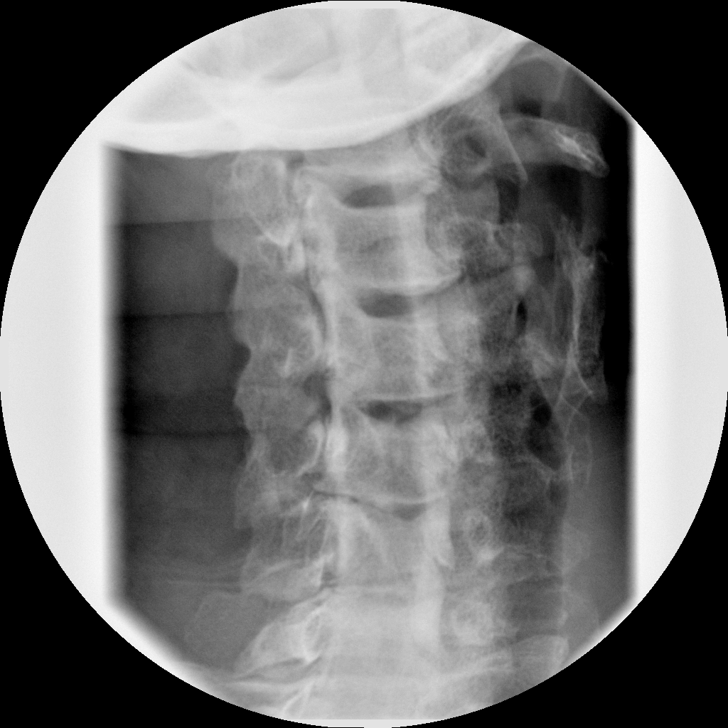

[Series 7: myelogram  white · 1 of 1 slices shown (6 of 8)]
[im 1/1]
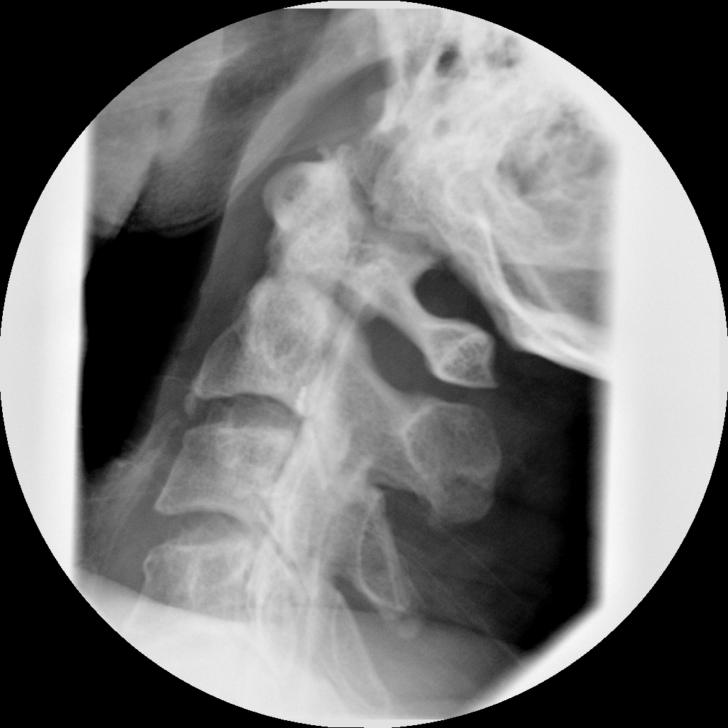

[Series 8: myelogram  white · 1 of 1 slices shown (7 of 8)]
[im 1/1]
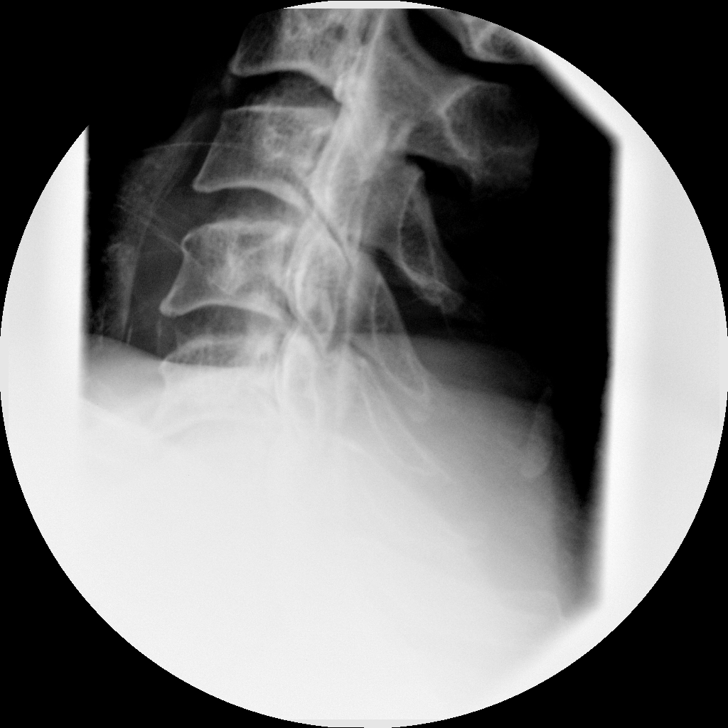

[Series 9: (hospital) · 1 of 1 slices shown (2 of 2)]
[im 1/1]
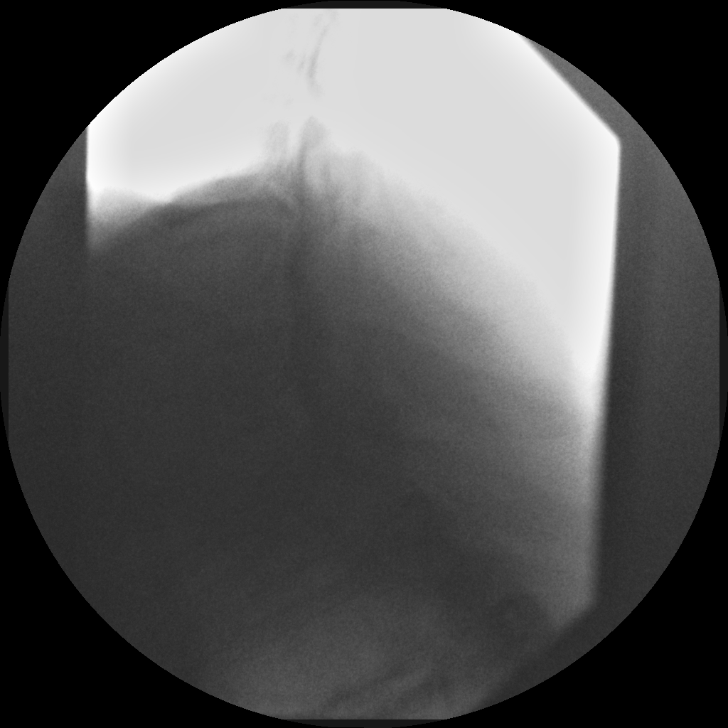

[Series 10: myelogram  white · 1 of 1 slices shown (8 of 8)]
[im 1/1]
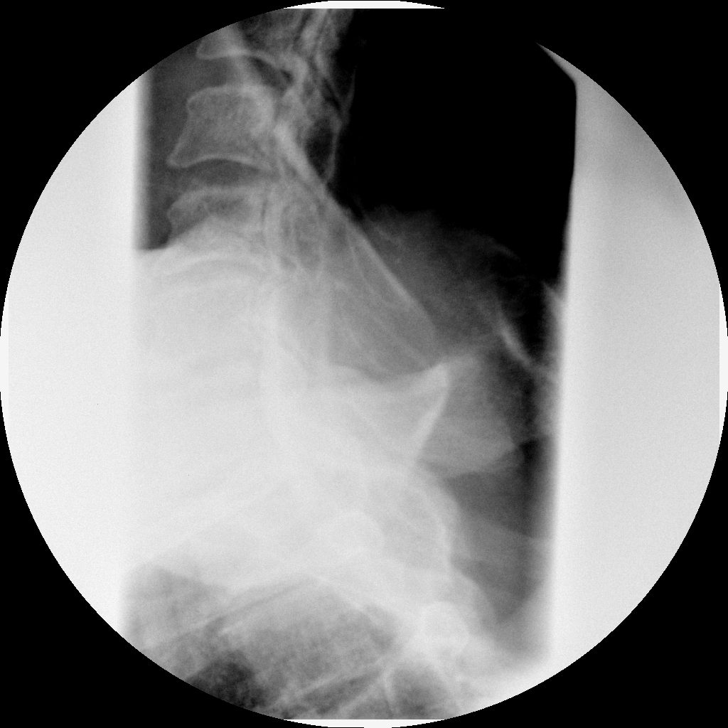

[10 of 10 positions shown; findings below may reference images not displayed]

FINDINGS: Normal alignment.  No prevertebral soft tissue swelling.
Paraspinal soft tissues unremarkable.  Visualized lung apices
clear.  The

C2-3: Unremarkable

C3-4: Left posterolateral mild   bulge results in very mild
flattening of the anterior aspect of the cervical cord.  No spinal
or foraminal stenosis.

C4-5: Small broad central protrusion with mild flattening of the
anterior aspect of the cervical cord and early spinal stenosis.
Asymmetric facet and uncovertebral degenerative hypertrophy and
spurring, right greater than left, with early right foraminal
encroachment.

C5-6: Broad posterior bulge with associated endplate spurring.
Asymmetric facet uncovertebral degenerative change contributes to
left foraminal encroachment. There is mild narrowing of the
interspace.

C6-7: There is bilateral uncovertebral early spurring with minimal
foraminal encroachment.  No spinal stenosis. There is narrowing of
the interspace with small Schmorl's nodes in the adjacent endplates
and anterior endplate spurring.

C7-T1: Unremarkable
IMPRESSION: 1.  Left posterolateral bulge C3-4 with mild anterior cord
flattening.
2.  Central protrusion and DJD result in right foraminal
encroachment C4-5 and early spinal stenosis.
3.  Left C5-6 foraminal encroachment secondary to disc bulge and
uncovertebral spurring.
4.  Uncovertebral spurring C6-7 with early foraminal encroachment.

## 2013-09-10 HISTORY — PX: COLONOSCOPY: SHX174

## 2017-08-26 ENCOUNTER — Other Ambulatory Visit: Payer: Self-pay | Admitting: Surgery

## 2017-09-05 NOTE — Patient Instructions (Addendum)
Christopher HuxleyMichael K Ross  09/05/2017   Your procedure is scheduled on: 09-12-17   [facility]  Entrance Take [facility] Elevators to 3rd floor to  Short Stay Center at 8:00 AM.   Call this number if you have problems the morning of surgery [phone]    Remember: ONLY 1 PERSON MAY GO WITH YOU TO SHORT STAY TO GET  READY MORNING OF YOUR SURGERY.  Do not eat food or drink liquids :After Midnight.     Take these medicines the morning of surgery with A SIP OF WATER: None                                You may not have any metal on your body including hair pins and              piercings  Do not wear jewelry, lotions, powders or deodorant             Men may shave face and neck.   Do not bring valuables to the hospital. [facility] IS NOT             RESPONSIBLE   FOR VALUABLES.  Contacts, dentures or bridgework may not be worn into surgery.     Patients discharged the day of surgery will not be allowed to drive home.  Name and phone number of your driver: Lynden AngCathy [phone]               Please read over the following fact sheets you were given: _____________________________________________________________________             [facility] - Preparing for Surgery Before surgery, you can play an important role.  Because skin is not sterile, your skin needs to be as free of germs as possible.  You can reduce the number of germs on your skin by washing with CHG (chlorahexidine gluconate) soap before surgery.  CHG is an antiseptic cleaner which kills germs and bonds with the skin to continue killing germs even after washing. Please DO NOT use if you have an allergy to CHG or antibacterial soaps.  If your skin becomes reddened/irritated stop using the CHG and inform your nurse when you arrive at Short Stay. Do not shave (including legs and underarms) for at least 48 hours prior to the first CHG shower.  You may shave your face/neck. Please follow these  instructions carefully:  1.  Shower with CHG Soap the night before surgery and the  morning of Surgery.  2.  If you choose to wash your hair, wash your hair first as usual with your  normal  shampoo.  3.  After you shampoo, rinse your hair and body thoroughly to remove the  shampoo.                           4.  Use CHG as you would any other liquid soap.  You can apply chg directly  to the skin and wash                       Gently with a scrungie or clean washcloth.  5.  Apply the CHG Soap to your body ONLY FROM THE NECK DOWN.   Do not use on face/ open  Wound or open sores. Avoid contact with eyes, ears mouth and genitals (private parts).                       Wash face,  Genitals (private parts) with your normal soap.             6.  Wash thoroughly, paying special attention to the area where your surgery  will be performed.  7.  Thoroughly rinse your body with warm water from the neck down.  8.  DO NOT shower/wash with your normal soap after using and rinsing off  the CHG Soap.                9.  Pat yourself dry with a clean towel.            10.  Wear clean pajamas.            11.  Place clean sheets on your bed the night of your first shower and do not  sleep with pets. Day of Surgery : Do not apply any lotions/deodorants the morning of surgery.  Please wear clean clothes to the hospital/surgery center.  FAILURE TO FOLLOW THESE INSTRUCTIONS MAY RESULT IN THE CANCELLATION OF YOUR SURGERY PATIENT SIGNATURE_________________________________  NURSE SIGNATURE__________________________________  ________________________________________________________________________

## 2017-09-06 ENCOUNTER — Other Ambulatory Visit: Payer: Self-pay

## 2017-09-06 ENCOUNTER — Encounter (HOSPITAL_COMMUNITY)
Admission: RE | Admit: 2017-09-06 | Discharge: 2017-09-06 | Disposition: A | Discharge status: Home / Self Care | Payer: BC Managed Care – PPO | Source: Ambulatory Visit | Attending: Surgery | Admitting: Surgery

## 2017-09-06 ENCOUNTER — Encounter (INDEPENDENT_AMBULATORY_CARE_PROVIDER_SITE_OTHER): Payer: Self-pay

## 2017-09-06 ENCOUNTER — Encounter (HOSPITAL_COMMUNITY): Payer: Self-pay

## 2017-09-06 DIAGNOSIS — Z01812 Encounter for preprocedural laboratory examination: Secondary | ICD-10-CM | POA: Diagnosis present

## 2017-09-06 DIAGNOSIS — K4090 Unilateral inguinal hernia, without obstruction or gangrene, not specified as recurrent: Secondary | ICD-10-CM | POA: Diagnosis not present

## 2017-09-06 LAB — CBC
HEMATOCRIT: 43.5 % (ref 39.0–52.0)
HEMOGLOBIN: 14.8 g/dL (ref 13.0–17.0)
MCH: 31.3 pg (ref 26.0–34.0)
MCHC: 34 g/dL (ref 30.0–36.0)
MCV: 92 fL (ref 78.0–100.0)
Platelets: 183 10*3/uL (ref 150–400)
RBC: 4.73 MIL/uL (ref 4.22–5.81)
RDW: 12.3 % (ref 11.5–15.5)
WBC: 6.9 10*3/uL (ref 4.0–10.5)

## 2017-09-06 LAB — BASIC METABOLIC PANEL
Anion gap: 7 (ref 5–15)
BUN: 21 mg/dL — AB (ref 6–20)
CHLORIDE: 104 mmol/L (ref 101–111)
CO2: 28 mmol/L (ref 22–32)
Calcium: 9.4 mg/dL (ref 8.9–10.3)
Creatinine, Ser: 1.06 mg/dL (ref 0.61–1.24)
GFR calc Af Amer: >60 mL/min (ref >60)
GFR calc non Af Amer: >60 mL/min (ref >60)
GLUCOSE: 89 mg/dL (ref 65–99)
POTASSIUM: 4.3 mmol/L (ref 3.5–5.1)
Sodium: 139 mmol/L (ref 135–145)

## 2017-09-11 NOTE — H&P (Addendum)
Christopher HuxleyMichael K Ross Documented: 08/26/2017 2:27 PM Location: [facility] Patient #: [id number] DOB: 09/15/1955 Married / Language: [place] / Race: White Male   History of Present Illness Christopher Ross(Christopher Biehn A. Magnus IvanBlackman MD; 08/26/2017 2:47 PM) The patient is a 62 year old male who presents with an inguinal hernia. This gentleman is a self-referral for evaluation of her right inguinal hernia. He was at home the other evening and noticed a bulge in his right groin which not painful or symptomatic. He presented to the emergency department and was found on physical examination to have a reducible right inguinal hernia. He reports he is active and lifts weights. Again he has had no groin pain. He is otherwise healthy without complaints.   Past Surgical History Doristine Devoid(Chemira Jones, CMA; 08/26/2017 2:40 PM) Spinal Surgery - Neck   Diagnostic Studies History Doristine Devoid(Chemira Jones, CMA; 08/26/2017 2:40 PM) Colonoscopy  5-10 years ago  Allergies (Tanisha A. Manson PasseyBrown, RMA; 08/26/2017 2:28 PM) No Known Drug Allergies [08/26/2017]: Allergies Reconciled   Medication History (Tanisha A. Manson PasseyBrown, RMA; 08/26/2017 2:28 PM) Zolpidem Tartrate (10MG  Tablet, Oral) Active. Simvastatin (10MG  Tablet, Oral) Active. Fish Oil + D3 (1000-1000MG -UNIT Capsule, Oral) Active. Medications Reconciled  Social History Doristine Devoid(Chemira Jones, CMA; 08/26/2017 2:40 PM) Alcohol use  Occasional alcohol use. Caffeine use  Carbonated beverages. No drug use  Tobacco use  Never smoker.  Family History Doristine Devoid(Chemira Jones, [place]; 08/26/2017 2:40 PM) First Degree Relatives  No pertinent family history   Other Problems Doristine Devoid(Chemira Jones, CMA; 08/26/2017 2:40 PM) Bladder Problems  Enlarged Prostate  Gastroesophageal Reflux Disease  Hypercholesterolemia  Inguinal Hernia     Review of Systems (Chemira Jones CMA; 08/26/2017 2:40 PM) General Not Present- Appetite Loss, Chills, Fatigue, Fever, Night Sweats, Weight Gain and Weight  Loss. Skin Not Present- Change in Wart/Mole, Dryness, Hives, Jaundice, New Lesions, Non-Healing Wounds, Rash and Ulcer. HEENT Not Present- Earache, Hearing Loss, Hoarseness, Nose Bleed, Oral Ulcers, Ringing in the Ears, Seasonal Allergies, Sinus Pain, Sore Throat, Visual Disturbances, Wears glasses/contact lenses and Yellow Eyes. Respiratory Not Present- Bloody sputum, Chronic Cough, Difficulty Breathing, Snoring and Wheezing. Breast Not Present- Breast Mass, Breast Pain, Nipple Discharge and Skin Changes. Cardiovascular Not Present- Chest Pain, Difficulty Breathing Lying Down, Leg Cramps, Palpitations, Rapid Heart Rate, Shortness of Breath and Swelling of Extremities. Gastrointestinal Present- Abdominal Pain. Not Present- Bloating, Bloody Stool, Change in Bowel Habits, Chronic diarrhea, Constipation, Difficulty Swallowing, Excessive gas, Gets full quickly at meals, Hemorrhoids, Indigestion, Nausea, Rectal Pain and Vomiting. Male Genitourinary Present- Frequency and Urgency. Not Present- Blood in Urine, Change in Urinary Stream, Impotence, Nocturia, Painful Urination and Urine Leakage.  Vitals (Tanisha A. Brown RMA; 08/26/2017 2:28 PM) 08/26/2017 2:27 PM Weight: 177 lb Temp.: 97.77F  Pulse: 73 (Regular)  BP: 124/82 (Sitting, Left Arm, Standard)       Physical Exam (Sherrill Buikema A. Magnus IvanBlackman MD; 08/26/2017 2:48 PM) General Mental Status-Alert. General Appearance-Consistent with stated age. Hydration-Well hydrated. Voice-Normal.  Head and Neck Head-normocephalic, atraumatic with no lesions or palpable masses. Trachea-midline.  Eye Eyeball - Bilateral-Extraocular movements intact. Sclera/Conjunctiva - Bilateral-No scleral icterus.  Chest and Lung Exam Chest and lung exam reveals -quiet, even and easy respiratory effort with no use of accessory muscles and on auscultation, normal breath sounds, no adventitious sounds and normal vocal resonance. Inspection Chest  Wall - Normal. Back - normal.  Cardiovascular Cardiovascular examination reveals -normal heart sounds, regular rate and rhythm with no murmurs and normal pedal pulses bilaterally.  Abdomen Inspection Skin - Scar - no surgical scars.  Hernias - Inguinal hernia - Left - no hernia. Right - Reducible. Note: He has a moderate sized, easily reducible, nontender right inguinal hernia. Palpation/Percussion Palpation and Percussion of the abdomen reveal - Soft, Non Tender, No Rebound tenderness, No Rigidity (guarding) and No hepatosplenomegaly. Auscultation Auscultation of the abdomen reveals - Bowel sounds normal.  Neurologic - Did not examine.  Musculoskeletal - Did not examine.    Assessment & Plan (Dmauri Rosenow A. Magnus Ivan MD; 08/26/2017 2:49 PM) RIGHT INGUINAL HERNIA (K40.90) Impression: I discussed the diagnosis of an inguinal hernia with the patient in detail. He is very anxious and very eager to have the hernia repaired. I discussed surgical repair as well as expected management. This is a moderate sized hernia so I would recommend repair. He is a very active individual and less weight. I discussed both the laparoscopic and open techniques for hernia repair. I also discussed use of mesh. After discussion, he wishes to proceed with a laparoscopic right inguinal hernia repair with mesh. We discussed the risks which includes but is not limited to bleeding, infection, injury to surrounding structures, the need for conversion to an open procedure, nerve entrapment, chronic pain, recurrent hernia, the need for further surgery, postoperative recovery, etc. He understands and wishes to proceed with surgery which will be scheduled

## 2017-09-12 ENCOUNTER — Ambulatory Visit (HOSPITAL_COMMUNITY): Payer: BC Managed Care – PPO | Admitting: Certified Registered Nurse Anesthetist

## 2017-09-12 ENCOUNTER — Encounter (HOSPITAL_COMMUNITY): Payer: Self-pay | Admitting: *Deleted

## 2017-09-12 ENCOUNTER — Ambulatory Visit (HOSPITAL_COMMUNITY)
Admission: RE | Admit: 2017-09-12 | Discharge: 2017-09-12 | Disposition: A | Discharge status: Home / Self Care | Payer: BC Managed Care – PPO | Source: Ambulatory Visit | Attending: Surgery | Admitting: Surgery

## 2017-09-12 ENCOUNTER — Encounter (HOSPITAL_COMMUNITY)
Admission: RE | Disposition: A | Discharge status: Home / Self Care | Payer: Self-pay | Source: Ambulatory Visit | Attending: Surgery

## 2017-09-12 DIAGNOSIS — K4090 Unilateral inguinal hernia, without obstruction or gangrene, not specified as recurrent: Secondary | ICD-10-CM | POA: Insufficient documentation

## 2017-09-12 DIAGNOSIS — K219 Gastro-esophageal reflux disease without esophagitis: Secondary | ICD-10-CM | POA: Diagnosis not present

## 2017-09-12 DIAGNOSIS — E7800 Pure hypercholesterolemia, unspecified: Secondary | ICD-10-CM | POA: Insufficient documentation

## 2017-09-12 DIAGNOSIS — Z79899 Other long term (current) drug therapy: Secondary | ICD-10-CM | POA: Diagnosis not present

## 2017-09-12 DIAGNOSIS — N400 Enlarged prostate without lower urinary tract symptoms: Secondary | ICD-10-CM | POA: Diagnosis not present

## 2017-09-12 HISTORY — PX: INGUINAL HERNIA REPAIR: SHX194

## 2017-09-12 HISTORY — PX: INSERTION OF MESH: SHX5868

## 2017-09-12 SURGERY — REPAIR, HERNIA, INGUINAL, LAPAROSCOPIC
Anesthesia: General | Site: Inguinal | Laterality: Right

## 2017-09-12 MED ORDER — OXYCODONE HCL 5 MG/5ML PO SOLN: 5.0000 mg | Freq: Once | ORAL | Status: DC | PRN

## 2017-09-12 MED ORDER — KETOROLAC TROMETHAMINE 30 MG/ML IJ SOLN
INTRAMUSCULAR | Status: AC
  Filled 2017-09-12: qty 1

## 2017-09-12 MED ORDER — ROCURONIUM BROMIDE 50 MG/5ML IV SOSY
PREFILLED_SYRINGE | INTRAVENOUS | Status: AC
  Filled 2017-09-12: qty 5

## 2017-09-12 MED ORDER — FENTANYL CITRATE (PF) 100 MCG/2ML IJ SOLN
25.0000 ug | INTRAMUSCULAR | Status: DC | PRN
  Administered 2017-09-12: 50 ug via INTRAVENOUS

## 2017-09-12 MED ORDER — FENTANYL CITRATE (PF) 100 MCG/2ML IJ SOLN
INTRAMUSCULAR | Status: DC | PRN
  Administered 2017-09-12 (×3): 50 ug via INTRAVENOUS

## 2017-09-12 MED ORDER — SUGAMMADEX SODIUM 200 MG/2ML IV SOLN
INTRAVENOUS | Status: DC | PRN
  Administered 2017-09-12: 200 mg via INTRAVENOUS

## 2017-09-12 MED ORDER — 0.9 % SODIUM CHLORIDE (POUR BTL) OPTIME
TOPICAL | Status: DC | PRN
  Administered 2017-09-12: 1000 mL

## 2017-09-12 MED ORDER — CHLORHEXIDINE GLUCONATE CLOTH 2 % EX PADS: 6.0000 | MEDICATED_PAD | Freq: Once | CUTANEOUS | Status: DC

## 2017-09-12 MED ORDER — DEXAMETHASONE SODIUM PHOSPHATE 10 MG/ML IJ SOLN
INTRAMUSCULAR | Status: DC | PRN
  Administered 2017-09-12: 10 mg via INTRAVENOUS

## 2017-09-12 MED ORDER — ONDANSETRON HCL 4 MG/2ML IJ SOLN
INTRAMUSCULAR | Status: DC | PRN
  Administered 2017-09-12: 4 mg via INTRAVENOUS

## 2017-09-12 MED ORDER — BUPIVACAINE-EPINEPHRINE 0.25% -1:200000 IJ SOLN
INTRAMUSCULAR | Status: AC
  Filled 2017-09-12: qty 1

## 2017-09-12 MED ORDER — OXYCODONE HCL 5 MG PO TABS: 5.0000 mg | ORAL_TABLET | Freq: Once | ORAL | Status: DC | PRN

## 2017-09-12 MED ORDER — PROPOFOL 10 MG/ML IV BOLUS
INTRAVENOUS | Status: DC | PRN
  Administered 2017-09-12: 150 mg via INTRAVENOUS

## 2017-09-12 MED ORDER — BUPIVACAINE-EPINEPHRINE 0.25% -1:200000 IJ SOLN
INTRAMUSCULAR | Status: DC | PRN
  Administered 2017-09-12: 20 mL

## 2017-09-12 MED ORDER — MIDAZOLAM HCL 5 MG/5ML IJ SOLN
INTRAMUSCULAR | Status: DC | PRN
  Administered 2017-09-12: 2 mg via INTRAVENOUS

## 2017-09-12 MED ORDER — LIDOCAINE 2% (20 MG/ML) 5 ML SYRINGE
INTRAMUSCULAR | Status: AC
  Filled 2017-09-12: qty 5

## 2017-09-12 MED ORDER — FENTANYL CITRATE (PF) 100 MCG/2ML IJ SOLN
INTRAMUSCULAR | Status: AC
  Administered 2017-09-12: 50 ug via INTRAVENOUS
  Filled 2017-09-12: qty 2

## 2017-09-12 MED ORDER — FENTANYL CITRATE (PF) 250 MCG/5ML IJ SOLN
INTRAMUSCULAR | Status: AC
  Filled 2017-09-12: qty 5

## 2017-09-12 MED ORDER — EPHEDRINE SULFATE 50 MG/ML IJ SOLN
INTRAMUSCULAR | Status: DC | PRN
  Administered 2017-09-12 (×2): 10 mg via INTRAVENOUS

## 2017-09-12 MED ORDER — ROCURONIUM BROMIDE 50 MG/5ML IV SOSY
PREFILLED_SYRINGE | INTRAVENOUS | Status: DC | PRN
  Administered 2017-09-12: 50 mg via INTRAVENOUS
  Administered 2017-09-12 (×2): 10 mg via INTRAVENOUS

## 2017-09-12 MED ORDER — ONDANSETRON HCL 4 MG/2ML IJ SOLN
INTRAMUSCULAR | Status: AC
  Filled 2017-09-12: qty 2

## 2017-09-12 MED ORDER — KETOROLAC TROMETHAMINE 30 MG/ML IJ SOLN
30.0000 mg | Freq: Once | INTRAMUSCULAR | Status: DC | PRN
  Administered 2017-09-12: 30 mg via INTRAVENOUS

## 2017-09-12 MED ORDER — LACTATED RINGERS IV SOLN
INTRAVENOUS | Status: DC
Start: 2017-09-12 — End: 2017-09-12
  Administered 2017-09-12: 08:00:00 via INTRAVENOUS

## 2017-09-12 MED ORDER — PROMETHAZINE HCL 25 MG/ML IJ SOLN: 6.2500 mg | INTRAMUSCULAR | Status: DC | PRN

## 2017-09-12 MED ORDER — MIDAZOLAM HCL 2 MG/2ML IJ SOLN
INTRAMUSCULAR | Status: AC
  Filled 2017-09-12: qty 2

## 2017-09-12 MED ORDER — DEXAMETHASONE SODIUM PHOSPHATE 10 MG/ML IJ SOLN
INTRAMUSCULAR | Status: AC
  Filled 2017-09-12: qty 1

## 2017-09-12 MED ORDER — OXYCODONE HCL 5 MG PO TABS: 5.0000 mg | ORAL_TABLET | Freq: Four times a day (QID) | ORAL | 0 refills | Status: DC | PRN

## 2017-09-12 MED ORDER — LIDOCAINE 2% (20 MG/ML) 5 ML SYRINGE
INTRAMUSCULAR | Status: DC | PRN
  Administered 2017-09-12: 80 mg via INTRAVENOUS

## 2017-09-12 MED ORDER — CEFAZOLIN SODIUM-DEXTROSE 2-4 GM/100ML-% IV SOLN
2.0000 g | INTRAVENOUS | Status: AC
  Administered 2017-09-12: 2 g via INTRAVENOUS
  Filled 2017-09-12: qty 100

## 2017-09-12 SURGICAL SUPPLY — 30 items
ADH SKN CLS APL DERMABOND .7 (GAUZE/BANDAGES/DRESSINGS) ×2
APL SKNCLS STERI-STRIP NONHPOA (GAUZE/BANDAGES/DRESSINGS) ×2
BANDAGE ADH SHEER 1  50/CT (GAUZE/BANDAGES/DRESSINGS) IMPLANT
BENZOIN TINCTURE PRP APPL 2/3 (GAUZE/BANDAGES/DRESSINGS) ×4 IMPLANT
CLOSURE WOUND 1/2 X4 (GAUZE/BANDAGES/DRESSINGS) ×1
DECANTER SPIKE VIAL GLASS SM (MISCELLANEOUS) ×4 IMPLANT
DERMABOND ADVANCED (GAUZE/BANDAGES/DRESSINGS) ×2
DERMABOND ADVANCED .7 DNX12 (GAUZE/BANDAGES/DRESSINGS) IMPLANT
DEVICE SECURE STRAP 25 ABSORB (INSTRUMENTS) ×4 IMPLANT
DISSECT BALLN SPACEMKR + OVL (BALLOONS) ×4
DISSECTOR BALLN SPACEMKR + OVL (BALLOONS) ×2 IMPLANT
DISSECTOR BLUNT TIP ENDO 5MM (MISCELLANEOUS) IMPLANT
ELECT REM PT RETURN 15FT ADLT (MISCELLANEOUS) ×4 IMPLANT
GLOVE SURG SIGNA 7.5 PF LTX (GLOVE) ×4 IMPLANT
GOWN STRL REUS W/TWL XL LVL3 (GOWN DISPOSABLE) ×8 IMPLANT
KIT BASIN OR (CUSTOM PROCEDURE TRAY) ×4 IMPLANT
MESH 3DMAX 4X6 RT LRG (Mesh General) ×2 IMPLANT
NDL INSUFFLATION 14GA 120MM (NEEDLE) IMPLANT
NEEDLE INSUFFLATION 14GA 120MM (NEEDLE) IMPLANT
PAD POSITIONING PINK XL (MISCELLANEOUS) IMPLANT
SCISSORS LAP 5X35 DISP (ENDOMECHANICALS) ×4 IMPLANT
SET IRRIG TUBING LAPAROSCOPIC (IRRIGATION / IRRIGATOR) IMPLANT
STRIP CLOSURE SKIN 1/2X4 (GAUZE/BANDAGES/DRESSINGS) ×3 IMPLANT
SUT MNCRL AB 4-0 PS2 18 (SUTURE) ×4 IMPLANT
TOWEL OR 17X26 10 PK STRL BLUE (TOWEL DISPOSABLE) ×4 IMPLANT
TOWEL OR NON WOVEN STRL DISP B (DISPOSABLE) ×4 IMPLANT
TRAY FOLEY W/METER SILVER 16FR (SET/KITS/TRAYS/PACK) ×4 IMPLANT
TRAY LAPAROSCOPIC (CUSTOM PROCEDURE TRAY) ×4 IMPLANT
TROCAR CANNULA W/PORT DUAL 5MM (MISCELLANEOUS) ×4 IMPLANT
TUBING INSUF HEATED (TUBING) ×4 IMPLANT

## 2017-09-12 NOTE — Op Note (Signed)
LAPAROSCOPIC RIGHT INGUINAL HERNIA REPAIR WITH MESH, INSERTION OF MESH  Procedure Note  Algernon HuxleyMichael K Egle 09/12/2017   Pre-op Diagnosis: right inguinal hernia     Post-op Diagnosis: same  Procedure(s): LAPAROSCOPIC RIGHT INGUINAL HERNIA REPAIR WITH MESH INSERTION OF MESH (Bard large 3D MAX prolene mesh)  Surgeon(s): Abigail MiyamotoBlackman, Ineta Sinning, MD  Anesthesia: General  Staff:  Circulator: Patsi SearsPozil, Traci L, RN Scrub Person: Southard, Su LeyStephanie M; Tuttle, KelsoMisty M, CST; Anner CreteWooten, Lori  Estimated Blood Loss: Minimal               Findings:  Patient was found to have an indirect right inguinal hernia  Procedure: The patient was brought to the operating room and identified as the correct patient. He was placed upon the operating room table and general anesthesia was induced. His abdomen was then prepped and draped in the usual sterile fashion. I made a small vertical incision below the umbilicus with a scalpel. I took this down to the fascia which was then opened with scalpel. The rectus muscle was then identified and elevated. I passed the dissecting balloon underneath the rectus muscle manipulated toward the pubis. The dissecting balloon was insufflated under direct vision dissecting out the preperitoneal space well. I then removed the dissecting balloon and insufflated the prepareof carbon dioxide. 25 mm trochars were placed the patient lower midline both under direct vision. There is no evidence of left inguinal hernia. The patient had an indirect right inguinal hernia. I was able to easily dissect the hernia sac free from the testicular cord structures. I saw that he was developing a small direct hernia as well. Next a piece of Bard 3-D max Prolene mesh brought to the field. A large piece was used. I placed it through the umbilical trocar and then open it as an on lay on the right inguinal floor. Using the absorbable tacker, the mesh was tacked to Cooper's ligament, up the medial duodenal wall, and  slightly laterally. Wide coverage of the inguinal floor and cord structures appear to be achieved.  At this point the midline trochars were removed after hemostasis appeared to be achieved. The preparedis seen to collapse appropriately. Mesh remained in place. At this point the fascia at the umbilical incision was then closed with a figure-of-eight 0 Vicryl suture. I anesthetized all incisions Marcaine performed a right ilioinguinal nerve block Marcaine as well. All skin incisions were then closed 4-0 Monocryl and Dermabond. The patient tolerated procedure well. All the counts were correct at the end of the procedure. The patient was then extubated in the operating room and taken in a stable condition to the recovery room.          Karem Tomaso A   Date: 09/12/2017  Time: 10:19 AM

## 2017-09-12 NOTE — Anesthesia Procedure Notes (Signed)
Procedure Name: Intubation Date/Time: 09/12/2017 9:38 AM Performed by: West Pugh, CRNA Pre-anesthesia Checklist: Patient identified, Emergency Drugs available, Suction available, Patient being monitored and Timeout performed Patient Re-evaluated:Patient Re-evaluated prior to induction Oxygen Delivery Method: Circle system utilized Preoxygenation: Pre-oxygenation with 100% oxygen Induction Type: IV induction Ventilation: Mask ventilation without difficulty and Oral airway inserted - appropriate to patient size Laryngoscope Size: Mac and 4 Grade View: Grade I Tube type: Oral Tube size: 7.5 mm Number of attempts: 1 Airway Equipment and Method: Stylet Placement Confirmation: ETT inserted through vocal cords under direct vision,  positive ETCO2,  CO2 detector and breath sounds checked- equal and bilateral Secured at: 22 cm Tube secured with: Tape Dental Injury: Teeth and Oropharynx as per pre-operative assessment  Comments: Neck and head maintained in neutral alignment during DL.

## 2017-09-12 NOTE — Discharge Instructions (Signed)
Moderate Conscious Sedation, Adult, Care After These instructions provide you with information about caring for yourself after your procedure. Your health care provider may also give you more specific instructions. Your treatment has been planned according to current medical practices, but problems sometimes occur. Call your health care provider if you have any problems or questions after your procedure. What can I expect after the procedure? After your procedure, it is common:  To feel sleepy for several hours.  To feel clumsy and have poor balance for several hours.  To have poor judgment for several hours.  To vomit if you eat too soon.  Follow these instructions at home: For at least 24 hours after the procedure:   Do not: ? Participate in activities where you could fall or become injured. ? Drive. ? Use heavy machinery. ? Drink alcohol. ? Take sleeping pills or medicines that cause drowsiness. ? Make important decisions or sign legal documents. ? Take care of children on your own.  Rest. Eating and drinking  Follow the diet recommended by your health care provider.  If you vomit: ? Drink water, juice, or soup when you can drink without vomiting. ? Make sure you have little or no nausea before eating solid foods. General instructions  Have a responsible adult stay with you until you are awake and alert.  Take over-the-counter and prescription medicines only as told by your health care provider.  If you smoke, do not smoke without supervision.  Keep all follow-up visits as told by your health care provider. This is important. Contact a health care provider if:  You keep feeling nauseous or you keep vomiting.  You feel light-headed.  You develop a rash.  You have a fever. Get help right away if:  You have trouble breathing. This information is not intended to replace advice given to you by your health care provider. Make sure you discuss any questions you have  with your health care provider. Document Released: 06/17/2013 Document Revised: 01/30/2016 Document Reviewed: 12/17/2015 Elsevier Interactive Patient Education  2018 [employer]   CCS _______[facility]  UMBILICAL OR INGUINAL HERNIA REPAIR: POST OP INSTRUCTIONS  Always review your discharge instruction sheet given to you by the facility where your surgery was performed. IF YOU HAVE DISABILITY OR FAMILY LEAVE FORMS, YOU MUST BRING THEM TO THE OFFICE FOR PROCESSING.   DO NOT GIVE THEM TO YOUR DOCTOR.  1. A  prescription for pain medication may be given to you upon discharge.  Take your pain medication as prescribed, if needed.  If narcotic pain medicine is not needed, then you may take acetaminophen (Tylenol) or ibuprofen (Advil) as needed. 2. Take your usually prescribed medications unless otherwise directed. If you need a refill on your pain medication, please contact your pharmacy.  They will contact our office to request authorization. Prescriptions will not be filled after 5 pm or on week-ends. 3. You should follow a light diet the first 24 hours after arrival home, such as soup and crackers, etc.  Be sure to include lots of fluids daily.  Resume your normal diet the day after surgery. 4.Most patients will experience some swelling and bruising around the umbilicus or in the groin and scrotum.  Ice packs and reclining will help.  Swelling and bruising can take several days to resolve.  6. It is common to experience some constipation if taking pain medication after surgery.  Increasing fluid intake and taking a stool softener (such as Colace) will usually help  or prevent this problem from occurring.  A mild laxative (Milk of Magnesia or Miralax) should be taken according to package directions if there are no bowel movements after 48 hours. 7. Unless discharge instructions indicate otherwise, you may remove your bandages 24-48 hours after surgery, and you may shower at that  time.  You may have steri-strips (small skin tapes) in place directly over the incision.  These strips should be left on the skin for 7-10 days.  If your surgeon used skin glue on the incision, you may shower in 24 hours.  The glue will flake off over the next 2-3 weeks.  Any sutures or staples will be removed at the office during your follow-up visit. 8. ACTIVITIES:  You may resume regular (light) daily activities beginning the next day--such as daily self-care, walking, climbing stairs--gradually increasing activities as tolerated.  You may have sexual intercourse when it is comfortable.  Refrain from any heavy lifting or straining until approved by your doctor.  a.You may drive when you are no longer taking prescription pain medication, you can comfortably wear a seatbelt, and you can safely maneuver your car and apply brakes. b.RETURN TO WORK:   _____________________________________________  9.You should see your doctor in the office for a follow-up appointment approximately 2-3 weeks after your surgery.  Make sure that you call for this appointment within a day or two after you arrive home to insure a convenient appointment time. 10.OTHER INSTRUCTIONS: __OK TO SHOWER STARTING TOMORROW ICE PACK, TYLENOL, AND IBUPROFEN ALSO FOR PAIN MIRALAX OR OTHER STOOL SOFTENER AS NEEDED  NO LIFTING MORE THAN 15 POUNDS FOR 4 WEEKS_______________________    _____________________________________  WHEN TO CALL YOUR DOCTOR: 1. Fever over 101.0 2. Inability to urinate 3. Nausea and/or vomiting 4. Extreme swelling or bruising 5. Continued bleeding from incision. 6. Increased pain, redness, or drainage from the incision  The clinic staff is available to answer your questions during regular business hours.  Please dont hesitate to call and ask to speak to one of the nurses for clinical concerns.  If you have a medical emergency, go to the nearest emergency room or call 911.  A surgeon from [facility] is always on call at the hospital   [address], [address], [place], [place]  [address] ?  [address] [address], [place], [place]   [phone] 1998 ? [phone] ? FAX [phone] Web site: [email or web address]

## 2017-09-12 NOTE — Anesthesia Preprocedure Evaluation (Signed)
Anesthesia Evaluation  Patient identified by MRN, date of birth, ID band Patient awake    Reviewed: Allergy & Precautions, NPO status , Patient's Chart, lab work & pertinent test results  Airway Mallampati: II  TM Distance: >3 FB Neck ROM: Full    Dental no notable dental hx.    Pulmonary neg pulmonary ROS,    Pulmonary exam normal breath sounds clear to auscultation       Cardiovascular negative cardio ROS Normal cardiovascular exam Rhythm:Regular Rate:Normal     Neuro/Psych negative neurological ROS  negative psych ROS   GI/Hepatic negative GI ROS, Neg liver ROS,   Endo/Other  negative endocrine ROS  Renal/GU negative Renal ROS  negative genitourinary   Musculoskeletal negative musculoskeletal ROS (+)   Abdominal   Peds negative pediatric ROS (+)  Hematology negative hematology ROS (+)   Anesthesia Other Findings   Reproductive/Obstetrics negative OB ROS                             Anesthesia Physical Anesthesia Plan  ASA: I  Anesthesia Plan: General   Post-op Pain Management:    Induction: Intravenous  PONV Risk Score and Plan: 2 and Ondansetron, Dexamethasone and Treatment may vary due to age or medical condition  Airway Management Planned: Oral ETT  Additional Equipment:   Intra-op Plan:   Post-operative Plan: Extubation in OR  Informed Consent: I have reviewed the patients History and Physical, chart, labs and discussed the procedure including the risks, benefits and alternatives for the proposed anesthesia with the patient or authorized representative who has indicated his/her understanding and acceptance.   Dental advisory given  Plan Discussed with: CRNA and Surgeon  Anesthesia Plan Comments:         Anesthesia Quick Evaluation  

## 2017-09-12 NOTE — Interval H&P Note (Signed)
History and Physical Interval Note: no change in H and P  09/12/2017 8:48 AM  Christopher Ross  has presented today for surgery, with the diagnosis of right inguinal hernia  The various methods of treatment have been discussed with the patient and family. After consideration of risks, benefits and other options for treatment, the patient has consented to  Procedure(s): LAPAROSCOPIC RIGHT INGUINAL HERNIA REPAIR WITH MESH (Right) INSERTION OF MESH (Right) as a surgical intervention .  The patient's history has been reviewed, patient examined, no change in status, stable for surgery.  I have reviewed the patient's chart and labs.  Questions were answered to the patient's satisfaction.     Takiya Belmares A

## 2017-09-12 NOTE — Transfer of Care (Signed)
Immediate Anesthesia Transfer of Care Note  Patient: Christopher Ross  Procedure(s) Performed: LAPAROSCOPIC RIGHT INGUINAL HERNIA REPAIR WITH MESH (Right Abdomen) INSERTION OF MESH (Right Inguinal)  Patient Location: PACU  Anesthesia Type:General  Level of Consciousness: drowsy and patient cooperative  Airway & Oxygen Therapy: Patient Spontanous Breathing and Patient connected to face mask oxygen  Post-op Assessment: Report given to RN and Post -op Vital signs reviewed and stable  Post vital signs: Reviewed  Last Vitals:  Vitals:   09/12/17 1027 09/12/17 1030  BP: 111/61 113/66  Pulse: 61 (!) 58  Resp: 14 13  Temp: (!) 36.4 C   SpO2: 100% 100%    Last Pain:  Vitals:   09/12/17 0823  TempSrc: Oral         Complications: No apparent anesthesia complications

## 2017-09-12 NOTE — Anesthesia Postprocedure Evaluation (Signed)
Anesthesia Post Note  Patient: Algernon HuxleyMichael K Convey  Procedure(s) Performed: LAPAROSCOPIC RIGHT INGUINAL HERNIA REPAIR WITH MESH (Right Abdomen) INSERTION OF MESH (Right Inguinal)     Patient location during evaluation: PACU Anesthesia Type: General Level of consciousness: awake and alert Pain management: pain level controlled Vital Signs Assessment: post-procedure vital signs reviewed and stable Respiratory status: spontaneous breathing, nonlabored ventilation, respiratory function stable and patient connected to nasal cannula oxygen Cardiovascular status: blood pressure returned to baseline and stable Postop Assessment: no apparent nausea or vomiting Anesthetic complications: no    Last Vitals:  Vitals:   09/12/17 1045 09/12/17 1100  BP: 118/71 106/64  Pulse: 60 74  Resp: 12 18  Temp:    SpO2: 100% 99%    Last Pain:  Vitals:   09/12/17 1100  TempSrc:   PainSc: 6                  Izen Petz S

## 2018-02-12 ENCOUNTER — Other Ambulatory Visit: Payer: Self-pay | Admitting: Urology

## 2018-02-14 ENCOUNTER — Encounter (INDEPENDENT_AMBULATORY_CARE_PROVIDER_SITE_OTHER): Payer: Self-pay | Admitting: Orthopedic Surgery

## 2018-02-14 ENCOUNTER — Ambulatory Visit (INDEPENDENT_AMBULATORY_CARE_PROVIDER_SITE_OTHER): Payer: BC Managed Care – PPO | Admitting: Orthopedic Surgery

## 2018-02-14 DIAGNOSIS — M1711 Unilateral primary osteoarthritis, right knee: Secondary | ICD-10-CM | POA: Diagnosis not present

## 2018-02-16 ENCOUNTER — Encounter (INDEPENDENT_AMBULATORY_CARE_PROVIDER_SITE_OTHER): Payer: Self-pay | Admitting: Orthopedic Surgery

## 2018-02-16 NOTE — Progress Notes (Signed)
Office Visit Note   Patient: Christopher Ross           Date of Birth: 03-Aug-1956           MRN: [id number] Visit Date: 02/14/2018 Requested by: Selinda Flavin, MD [address] [place], [place] [address] PCP: Selinda Flavin, MD  Subjective: Chief Complaint  Patient presents with  . Right Knee - Pain    HPI: Hawke is a patient with right knee pain of 2 months duration.  Denies any history of injury.  Has not had any surgery on the knee.  He is tried anti-inflammatories which have not helped.  He also has had an injection about 6 weeks ago which did not help.  It hurts him to drive.  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  He has a prostate procedure on June 21.  He likes to do workouts at the [facility].  No personal or family history of DVT or pulmonary embolism.  He does have stairs at home.  He also has his wife with him at home.              ROS: All systems reviewed are negative as they relate to the chief complaint within the history of present illness.  Patient denies  fevers or chills.   Assessment & Plan: Visit Diagnoses:  1. Unilateral primary osteoarthritis, right knee     Plan: Impression is fairly rapidly progressing symptomatic right knee arthritis.  He has a flexion contracture of about 10 to 15 degrees.  He has flexion to about 90 degrees.  Overall he is a thin muscular healthy-appearing patient with fairly significant right knee arthritis.  We discussed operative and nonoperative options.  Nonoperative options would be a series of cortisone injections alternating with gel injections.  He is not too inclined on the injection aspect based on his lack of response to the injection about a month ago.  Also discussed with him knee replacement surgery including the risk and benefits.  Primary risk include infection nerve vessel damage limited motion and incomplete pain relief.  Duration of prosthesis also an issue.  Nonetheless I think that he is heading for that procedure at some  time in the future.  He would like to proceed with knee replacement which I think does make the most sense for him.  We will plan on knee replacement surgery several weeks after his prostate procedure.  For elective surgery I would like for him to have the prostate procedure done before elective knee replacement.  Patient understands the risk and benefits.  All questions answered  Follow-Up Instructions: No follow-ups on file.   Orders:  No orders of the defined types were placed in this encounter.  No orders of the defined types were placed in this encounter.     Procedures: No procedures performed   Clinical Data: No additional findings.  Objective: Vital Signs: There were no vitals taken for this visit.  Physical Exam:   Constitutional: Patient appears well-developed HEENT:  Head: Normocephalic Eyes:EOM are normal Neck: Normal range of motion Cardiovascular: Normal rate Pulmonary/chest: Effort normal Neurologic: Patient is alert Skin: Skin is warm Psychiatric: Patient has normal mood and affect    Ortho Exam: Orthopedic exam demonstrates full active and passive range of motion of the left knee.  Right knee has a 10 to 15 degree flexion contracture and flexion only to 90 degrees.  Varus alignment is present.  Pedal pulses palpable.  No  groin pain with internal/external rotation of the leg.  No other masses lymphadenopathy or skin changes noted in the right knee region.  Specialty Comments:  No specialty comments available.  Imaging: No results found.   PMFS History: There are no active problems to display for this patient.  Past Medical History:  Diagnosis Date  . Hyperlipidemia     History reviewed. No pertinent family history.  Past Surgical History:  Procedure Laterality Date  . ANTERIOR CERVICAL DECOMP/DISCECTOMY FUSION  07/30/2012   Procedure: ANTERIOR CERVICAL DECOMPRESSION/DISCECTOMY FUSION 2 LEVELS;  Surgeon: Cristi LoronJeffrey D Jenkins, MD;  Location: [facility];  Service: Neurosurgery;  Laterality: N/A;  Cervical four-five,Cervical five-six anterior cervical decompression with fusion interbody prothesis plating and bonegraft  . FRACTURE SURGERY     broken left arm, repair  . INGUINAL HERNIA REPAIR Right 09/12/2017   Procedure: LAPAROSCOPIC RIGHT INGUINAL HERNIA REPAIR WITH MESH;  Surgeon: Abigail MiyamotoBlackman, Douglas, MD;  Location: [facility] ORS;  Service: General;  Laterality: Right;  . INSERTION OF MESH Right 09/12/2017   Procedure: INSERTION OF MESH;  Surgeon: Abigail MiyamotoBlackman, Douglas, MD;  Location: [facility] ORS;  Service: General;  Laterality: Right;   Social History   Occupational History  . Not on file  Tobacco Use  . Smoking status: Never Smoker  . Smokeless tobacco: Never Used  Substance and Sexual Activity  . Alcohol use: Yes    Comment: occasional   . Drug use: No  . Sexual activity: Not on file

## 2018-02-19 ENCOUNTER — Other Ambulatory Visit (INDEPENDENT_AMBULATORY_CARE_PROVIDER_SITE_OTHER): Payer: Self-pay

## 2018-02-19 ENCOUNTER — Encounter (HOSPITAL_COMMUNITY): Payer: Self-pay

## 2018-02-19 NOTE — Patient Instructions (Signed)
Your procedure is scheduled on: Friday, February 28, 2018   Surgery Time:  1:00PM-2:00PM   [facility]    Report to admitting at 11:00 AM   Call this number if you have problems the morning of surgery [phone]   Do not eat food or drink liquids :After Midnight.   Do NOT smoke after Midnight   Take these medicines the morning of surgery with A SIP OF WATER: None                               You may not have any metal on your body including  jewelry, and body piercings             Do not wear  lotions, powders, perfumes/cologne, or deodorant                          Men may shave face and neck.   Do not bring valuables to the hospital. [facility] IS NOT             RESPONSIBLE   FOR VALUABLES.   Contacts, dentures or bridgework may not be worn into surgery.   Leave suitcase in the car. After surgery it may be brought to your room.   Special Instructions: Bring a copy of your healthcare power of attorney and living will documents         the day of surgery if you haven't scanned them in before.              Please read over the following fact sheets you were given:  [facility] - Preparing [facility] Before surgery, you can play an important role.  Because skin is not sterile, your skin needs to be as free of germs as possible.  You can reduce the number of germs on your skin by washing with CHG (chlorahexidine gluconate) soap before surgery.  CHG is an antiseptic cleaner which kills germs and bonds with the skin to continue killing germs even after washing. Please DO NOT use if you have an allergy to CHG or antibacterial soaps.  If your skin becomes reddened/irritated stop using the CHG and inform your nurse when you arrive at Short Stay. Do not shave (including legs and underarms) for at least 48 hours prior to the first CHG shower.  You may shave your face/neck.  Please follow these instructions carefully:  1.  Shower with CHG Soap the  night before surgery and the  morning of surgery.  2.  If you choose to wash your hair, wash your hair first as usual with your normal  shampoo.  3.  After you shampoo, rinse your hair and body thoroughly to remove the shampoo.                             4.  Use CHG as you would any other liquid soap.  You can apply chg directly to the skin and wash.  Gently with a scrungie or clean washcloth.  5.  Apply the CHG Soap to your body ONLY FROM THE NECK DOWN.   Do   not use on face/ open                           Wound or  open sores. Avoid contact with eyes, ears mouth and   genitals (private parts).                       Wash face,  Genitals (private parts) with your normal soap.             6.  Wash thoroughly, paying special attention to the area where your    surgery  will be performed.  7.  Thoroughly rinse your body with warm water from the neck down.  8.  DO NOT shower/wash with your normal soap after using and rinsing off the CHG Soap.                9.  Pat yourself dry with a clean towel.            10.  Wear clean pajamas.            11.  Place clean sheets on your bed the night of your first shower and do not  sleep with pets. Day of Surgery : Do not apply any lotions/deodorants the morning of surgery.  Please wear clean clothes to the hospital/surgery center.  FAILURE TO FOLLOW THESE INSTRUCTIONS MAY RESULT IN THE CANCELLATION OF YOUR SURGERY  PATIENT SIGNATURE_________________________________  NURSE SIGNATURE__________________________________  ________________________________________________________________________

## 2018-02-21 ENCOUNTER — Inpatient Hospital Stay (HOSPITAL_COMMUNITY)
Admission: RE | Admit: 2018-02-21 | Discharge: 2018-02-21 | Disposition: A | Discharge status: Home / Self Care | Payer: BC Managed Care – PPO | Source: Ambulatory Visit

## 2018-02-21 HISTORY — DX: Unilateral inguinal hernia, without obstruction or gangrene, not specified as recurrent: K40.90

## 2018-02-21 HISTORY — DX: Unilateral primary osteoarthritis, unspecified knee: M17.10

## 2018-02-21 HISTORY — DX: Osteoarthritis of knee, unspecified: M17.9

## 2018-02-26 NOTE — Patient Instructions (Addendum)
Christopher Ross  02/26/2018   Your procedure is scheduled on: 02-28-18  Report to [facility] Main  Entrance  Report to admitting at 1030 AM    Call this number if you have problems the morning of surgery [phone]   Remember: Do not eat food :After Midnight.CLEAR LIQUIDS FROM MIDNIGHT UNTIL 700 AM DAY OF SURGERY, THEN NOTHING BY MOUTH.    CLEAR LIQUID DIET   Foods Allowed                                                                     Foods Excluded  Coffee and tea, regular and decaf                             liquids that you cannot  Plain Jell-O in any flavor                                             see through such as: Fruit ices (not with fruit pulp)                                     milk, soups, orange juice  Iced Popsicles                                    All solid food Carbonated beverages, regular and diet                                    Cranberry, grape and apple juices Sports drinks like Gatorade Lightly seasoned clear broth or consume(fat free) Sugar, honey syrup  Sample Menu Breakfast                                Lunch                                     Supper Cranberry juice                    Beef broth                            Chicken broth Jell-O                                     Grape juice                           Apple juice Coffee or tea  Jell-O                                      Popsicle                                                Coffee or tea                        Coffee or tea  _____________________________________________________________________       Take these medicines the morning of surgery with A SIP OF WATER: NONE                               You may not have any metal on your body including hair pins and              piercings  Do not wear jewelry, make-up, lotions, powders or perfumes, deodorant             Do not wear nail polish.  Do not shave  48 hours prior  to surgery.              Men may shave face and neck.   Do not bring valuables to the hospital. [facility] IS NOT             RESPONSIBLE   FOR VALUABLES.  Contacts, dentures or bridgework may not be worn into surgery.  Leave suitcase in the car. After surgery it may be brought to your room.                  Please read over the following fact sheets you were given: _____________________________________________________________________             [facility] - Preparing for [facility] Before surgery, you can play an important role.  Because skin is not sterile, your skin needs to be as free of germs as possible.  You can reduce the number of germs on your skin by washing with CHG (chlorahexidine gluconate) soap before surgery.  CHG is an antiseptic cleaner which kills germs and bonds with the skin to continue killing germs even after washing. Please DO NOT use if you have an allergy to CHG or antibacterial soaps.  If your skin becomes reddened/irritated stop using the CHG and inform your nurse when you arrive at Short Stay. Do not shave (including legs and underarms) for at least 48 hours prior to the first CHG shower.  You may shave your face/neck. Please follow these instructions carefully:  1.  Shower with CHG Soap the night before surgery and the  morning of Surgery.  2.  If you choose to wash your hair, wash your hair first as usual with your  normal  shampoo.  3.  After you shampoo, rinse your hair and body thoroughly to remove the  shampoo.                           4.  Use CHG as you would any other liquid soap.  You can apply chg directly  to the skin and wash  Gently with a scrungie or clean washcloth.  5.  Apply the CHG Soap to your body ONLY FROM THE NECK DOWN.   Do not use on face/ open                           Wound or open sores. Avoid contact with eyes, ears mouth and genitals (private parts).                       Wash face,  Genitals (private parts)  with your normal soap.             6.  Wash thoroughly, paying special attention to the area where your surgery  will be performed.  7.  Thoroughly rinse your body with warm water from the neck down.  8.  DO NOT shower/wash with your normal soap after using and rinsing off  the CHG Soap.                9.  Pat yourself dry with a clean towel.            10.  Wear clean pajamas.            11.  Place clean sheets on your bed the night of your first shower and do not  sleep with pets. Day of Surgery : Do not apply any lotions/deodorants the morning of surgery.  Please wear clean clothes to the hospital/surgery center.  FAILURE TO FOLLOW THESE INSTRUCTIONS MAY RESULT IN THE CANCELLATION OF YOUR SURGERY PATIENT SIGNATURE_________________________________  NURSE SIGNATURE__________________________________  ________________________________________________________________________

## 2018-02-27 ENCOUNTER — Encounter (HOSPITAL_COMMUNITY)
Admission: RE | Admit: 2018-02-27 | Discharge: 2018-02-27 | Disposition: A | Discharge status: Home / Self Care | Payer: BC Managed Care – PPO | Source: Ambulatory Visit | Attending: Urology | Admitting: Urology

## 2018-02-27 ENCOUNTER — Other Ambulatory Visit: Payer: Self-pay

## 2018-02-27 ENCOUNTER — Encounter (HOSPITAL_COMMUNITY): Payer: Self-pay | Admitting: *Deleted

## 2018-02-27 DIAGNOSIS — Z01812 Encounter for preprocedural laboratory examination: Secondary | ICD-10-CM | POA: Insufficient documentation

## 2018-02-27 DIAGNOSIS — E785 Hyperlipidemia, unspecified: Secondary | ICD-10-CM | POA: Diagnosis not present

## 2018-02-27 DIAGNOSIS — M1711 Unilateral primary osteoarthritis, right knee: Secondary | ICD-10-CM | POA: Diagnosis not present

## 2018-02-27 DIAGNOSIS — W540XXA Bitten by dog, initial encounter: Secondary | ICD-10-CM

## 2018-02-27 HISTORY — DX: Bitten by dog, initial encounter: W54.0XXA

## 2018-02-27 LAB — CBC
HEMATOCRIT: 43.4 % (ref 39.0–52.0)
Hemoglobin: 14.7 g/dL (ref 13.0–17.0)
MCH: 31.1 pg (ref 26.0–34.0)
MCHC: 33.9 g/dL (ref 30.0–36.0)
MCV: 91.8 fL (ref 78.0–100.0)
PLATELETS: 201 10*3/uL (ref 150–400)
RBC: 4.73 MIL/uL (ref 4.22–5.81)
RDW: 12.6 % (ref 11.5–15.5)
WBC: 8.9 10*3/uL (ref 4.0–10.5)

## 2018-02-28 ENCOUNTER — Encounter (HOSPITAL_COMMUNITY): Payer: Self-pay | Admitting: *Deleted

## 2018-02-28 ENCOUNTER — Encounter (HOSPITAL_COMMUNITY)
Admission: RE | Disposition: A | Discharge status: Home / Self Care | Payer: Self-pay | Source: Ambulatory Visit | Attending: Urology

## 2018-02-28 ENCOUNTER — Observation Stay (HOSPITAL_COMMUNITY)
Admission: RE | Admit: 2018-02-28 | Discharge: 2018-03-01 | Disposition: A | Discharge status: Home / Self Care | Payer: BC Managed Care – PPO | Source: Ambulatory Visit | Attending: Urology | Admitting: Urology

## 2018-02-28 ENCOUNTER — Ambulatory Visit (HOSPITAL_COMMUNITY): Payer: BC Managed Care – PPO | Admitting: Certified Registered Nurse Anesthetist

## 2018-02-28 ENCOUNTER — Other Ambulatory Visit: Payer: Self-pay

## 2018-02-28 DIAGNOSIS — N400 Enlarged prostate without lower urinary tract symptoms: Secondary | ICD-10-CM | POA: Diagnosis present

## 2018-02-28 DIAGNOSIS — E785 Hyperlipidemia, unspecified: Secondary | ICD-10-CM | POA: Diagnosis not present

## 2018-02-28 DIAGNOSIS — R3915 Urgency of urination: Secondary | ICD-10-CM | POA: Diagnosis not present

## 2018-02-28 DIAGNOSIS — Z79899 Other long term (current) drug therapy: Secondary | ICD-10-CM | POA: Insufficient documentation

## 2018-02-28 DIAGNOSIS — R351 Nocturia: Secondary | ICD-10-CM | POA: Insufficient documentation

## 2018-02-28 HISTORY — DX: Benign prostatic hyperplasia without lower urinary tract symptoms: N40.0

## 2018-02-28 HISTORY — PX: TRANSURETHRAL RESECTION OF PROSTATE: SHX73

## 2018-02-28 HISTORY — DX: Bitten by dog, initial encounter: W54.0XXA

## 2018-02-28 SURGERY — TURP (TRANSURETHRAL RESECTION OF PROSTATE)
Anesthesia: General

## 2018-02-28 MED ORDER — EPHEDRINE 5 MG/ML INJ
INTRAVENOUS | Status: AC
  Filled 2018-02-28: qty 10

## 2018-02-28 MED ORDER — FENTANYL CITRATE (PF) 100 MCG/2ML IJ SOLN
INTRAMUSCULAR | Status: AC
  Filled 2018-02-28: qty 2

## 2018-02-28 MED ORDER — SENNOSIDES-DOCUSATE SODIUM 8.6-50 MG PO TABS
1.0000 | ORAL_TABLET | Freq: Two times a day (BID) | ORAL | Status: DC
  Administered 2018-02-28 – 2018-03-01 (×2): 1 via ORAL
  Filled 2018-02-28 (×2): qty 1

## 2018-02-28 MED ORDER — FENTANYL CITRATE (PF) 100 MCG/2ML IJ SOLN
INTRAMUSCULAR | Status: AC
Start: 2018-02-28 — End: ?
  Filled 2018-02-28: qty 2

## 2018-02-28 MED ORDER — FENTANYL CITRATE (PF) 100 MCG/2ML IJ SOLN
25.0000 ug | INTRAMUSCULAR | Status: DC | PRN
  Administered 2018-02-28: 25 ug via INTRAVENOUS
  Administered 2018-02-28: 50 ug via INTRAVENOUS
  Administered 2018-02-28 (×3): 25 ug via INTRAVENOUS

## 2018-02-28 MED ORDER — FENTANYL CITRATE (PF) 100 MCG/2ML IJ SOLN
INTRAMUSCULAR | Status: DC | PRN
  Administered 2018-02-28: 25 ug via INTRAVENOUS
  Administered 2018-02-28: 50 ug via INTRAVENOUS
  Administered 2018-02-28 (×5): 25 ug via INTRAVENOUS

## 2018-02-28 MED ORDER — KCL IN DEXTROSE-NACL 20-5-0.45 MEQ/L-%-% IV SOLN
INTRAVENOUS | Status: DC
  Administered 2018-02-28 – 2018-03-01 (×2): via INTRAVENOUS
  Filled 2018-02-28 (×3): qty 1000

## 2018-02-28 MED ORDER — LACTATED RINGERS IV SOLN
INTRAVENOUS | Status: DC
  Administered 2018-02-28: 12:00:00 via INTRAVENOUS

## 2018-02-28 MED ORDER — LIDOCAINE 2% (20 MG/ML) 5 ML SYRINGE
INTRAMUSCULAR | Status: AC
  Filled 2018-02-28: qty 5

## 2018-02-28 MED ORDER — SIMVASTATIN 20 MG PO TABS
10.0000 mg | ORAL_TABLET | Freq: Every day | ORAL | Status: DC
  Administered 2018-02-28: 10 mg via ORAL
  Filled 2018-02-28: qty 1

## 2018-02-28 MED ORDER — HYDROMORPHONE HCL 1 MG/ML IJ SOLN
0.2500 mg | INTRAMUSCULAR | Status: DC | PRN
  Administered 2018-02-28 (×2): 0.5 mg via INTRAVENOUS

## 2018-02-28 MED ORDER — PROPOFOL 10 MG/ML IV BOLUS
INTRAVENOUS | Status: DC | PRN
  Administered 2018-02-28: 150 mg via INTRAVENOUS

## 2018-02-28 MED ORDER — ONDANSETRON HCL 4 MG/2ML IJ SOLN
INTRAMUSCULAR | Status: DC | PRN
  Administered 2018-02-28: 4 mg via INTRAVENOUS

## 2018-02-28 MED ORDER — LIDOCAINE 2% (20 MG/ML) 5 ML SYRINGE
INTRAMUSCULAR | Status: DC | PRN
  Administered 2018-02-28: 100 mg via INTRAVENOUS

## 2018-02-28 MED ORDER — HYDROMORPHONE HCL 1 MG/ML IJ SOLN
INTRAMUSCULAR | Status: AC
  Filled 2018-02-28: qty 1

## 2018-02-28 MED ORDER — ZOLPIDEM TARTRATE 5 MG PO TABS
2.5000 mg | ORAL_TABLET | Freq: Every day | ORAL | Status: DC
  Administered 2018-02-28: 2.5 mg via ORAL
  Filled 2018-02-28: qty 1

## 2018-02-28 MED ORDER — MIDAZOLAM HCL 5 MG/5ML IJ SOLN
INTRAMUSCULAR | Status: DC | PRN
  Administered 2018-02-28: 2 mg via INTRAVENOUS

## 2018-02-28 MED ORDER — PROMETHAZINE HCL 25 MG/ML IJ SOLN: 6.2500 mg | INTRAMUSCULAR | Status: DC | PRN

## 2018-02-28 MED ORDER — PROPOFOL 10 MG/ML IV BOLUS
INTRAVENOUS | Status: AC
  Filled 2018-02-28: qty 20

## 2018-02-28 MED ORDER — SODIUM CHLORIDE 0.9 % IR SOLN
Status: DC | PRN
  Administered 2018-02-28: 6000 mL

## 2018-02-28 MED ORDER — MIDAZOLAM HCL 2 MG/2ML IJ SOLN
INTRAMUSCULAR | Status: AC
  Filled 2018-02-28: qty 2

## 2018-02-28 MED ORDER — HYDROMORPHONE HCL 1 MG/ML IJ SOLN
0.5000 mg | INTRAMUSCULAR | Status: DC | PRN
  Administered 2018-02-28 – 2018-03-01 (×4): 1 mg via INTRAVENOUS
  Filled 2018-02-28 (×4): qty 1

## 2018-02-28 MED ORDER — ACETAMINOPHEN 500 MG PO TABS
1000.0000 mg | ORAL_TABLET | Freq: Three times a day (TID) | ORAL | Status: AC
  Administered 2018-02-28 – 2018-03-01 (×3): 1000 mg via ORAL
  Filled 2018-02-28 (×3): qty 2

## 2018-02-28 MED ORDER — OXYCODONE HCL 5 MG PO TABS
5.0000 mg | ORAL_TABLET | ORAL | Status: DC | PRN
  Administered 2018-02-28 (×2): 5 mg via ORAL
  Filled 2018-02-28 (×2): qty 1

## 2018-02-28 MED ORDER — ONDANSETRON HCL 4 MG/2ML IJ SOLN
INTRAMUSCULAR | Status: AC
  Filled 2018-02-28: qty 2

## 2018-02-28 MED ORDER — SODIUM CHLORIDE 0.9 % IR SOLN
3000.0000 mL | Status: DC
Start: 2018-02-28 — End: 2018-03-01

## 2018-02-28 MED ORDER — GENTAMICIN SULFATE 40 MG/ML IJ SOLN
5.0000 mg/kg | INTRAVENOUS | Status: AC
  Administered 2018-02-28: 400 mg via INTRAVENOUS
  Filled 2018-02-28: qty 10

## 2018-02-28 SURGICAL SUPPLY — 18 items
BAG URINE DRAINAGE (UROLOGICAL SUPPLIES) ×3 IMPLANT
BAG URO CATCHER STRL LF (MISCELLANEOUS) ×3 IMPLANT
CATH FOLEY 3WAY 30CC 22FR (CATHETERS) ×2 IMPLANT
CATH HEMA 3WAY 30CC 22FR COUDE (CATHETERS) IMPLANT
COVER FOOTSWITCH UNIV (MISCELLANEOUS) IMPLANT
COVER SURGICAL LIGHT HANDLE (MISCELLANEOUS) ×1 IMPLANT
ELECT REM PT RETURN 15FT ADLT (MISCELLANEOUS) ×1 IMPLANT
GLOVE BIOGEL M STRL SZ7.5 (GLOVE) ×3 IMPLANT
GOWN STRL REUS W/TWL LRG LVL3 (GOWN DISPOSABLE) ×3 IMPLANT
GOWN STRL REUS W/TWL XL LVL3 (GOWN DISPOSABLE) ×1 IMPLANT
HOLDER FOLEY CATH W/STRAP (MISCELLANEOUS) IMPLANT
LOOP CUT BIPOLAR 24F LRG (ELECTROSURGICAL) ×2 IMPLANT
MANIFOLD NEPTUNE II (INSTRUMENTS) ×3 IMPLANT
PACK CYSTO (CUSTOM PROCEDURE TRAY) ×3 IMPLANT
SYRINGE IRR TOOMEY STRL 70CC (SYRINGE) ×3 IMPLANT
TUBING CONNECTING 10 (TUBING) ×2 IMPLANT
TUBING CONNECTING 10' (TUBING) ×1
TUBING UROLOGY SET (TUBING) ×3 IMPLANT

## 2018-02-28 NOTE — Anesthesia Procedure Notes (Signed)
Procedure Name: LMA Insertion Date/Time: 02/28/2018 12:19 PM Performed by: Orest DikesPeters, Lauralyn Shadowens J, CRNA Pre-anesthesia Checklist: Patient identified, Emergency Drugs available, Suction available and Patient being monitored Patient Re-evaluated:Patient Re-evaluated prior to induction Oxygen Delivery Method: Circle system utilized Preoxygenation: Pre-oxygenation with 100% oxygen Induction Type: IV induction LMA: LMA inserted LMA Size: 4.0 Number of attempts: 1 Placement Confirmation: positive ETCO2 and breath sounds checked- equal and bilateral Tube secured with: Tape Dental Injury: Teeth and Oropharynx as per pre-operative assessment

## 2018-02-28 NOTE — Anesthesia Postprocedure Evaluation (Signed)
Anesthesia Post Note  Patient: Algernon HuxleyMichael K Fogelman  Procedure(s) Performed: TRANSURETHRAL RESECTION OF THE PROSTATE (TURP) (N/A )     Patient location during evaluation: PACU Anesthesia Type: General Level of consciousness: awake and alert Pain management: pain level controlled Vital Signs Assessment: post-procedure vital signs reviewed and stable Respiratory status: spontaneous breathing, nonlabored ventilation and respiratory function stable Cardiovascular status: blood pressure returned to baseline and stable Postop Assessment: no apparent nausea or vomiting Anesthetic complications: no    Last Vitals:  Vitals:   02/28/18 1422 02/28/18 1436  BP: 106/70 116/78  Pulse: 73 69  Resp: 14 20  Temp: (!) 36.4 C (!) 36.3 C  SpO2: 100% 97%    Last Pain:  Vitals:   02/28/18 1442  TempSrc:   PainSc: 9                  Cecile HearingStephen Edward Rheba Diamond

## 2018-02-28 NOTE — H&P (Signed)
Christopher Ross is an 62 y.o. male.    Chief Complaint: Pre-Op Transurethral Resection of Prostate  HPI:    1 - Lower Urinary Tract Symptoms / Urinary Urgency / Nocturia / Detrussor Hyperactivity with Impaired Contractility - pt with progressive bother from mostly irritative symptoms with urgency / frequency / nocturia up to 10 per night x few months 2019. DRE 01/2018 45gm smooth. UA normal x several (no glucose / infection). PVR 01/2018 "0mL". Given trial of empiric alpha blocker (tamsulosin) and anticholinergic and reports minimal benefit. Cysto 01/2018 w/o stricture and only moderate bilobar prostatic hypertrophy (estimate prostatic urethral length <4cm). UDS 02/2018 confirm mixed picure with some low amplitude instability and poor contractility and flow with Pdet 38 at Qm 74ml/sec.   2 - Prostate Screening - No FHX prostate cancer.  01/2018 PSA 0.97 / DRE 50gm smooth.   PMH sig for lap inguinal hernia repair, hand surgery. NO CV disease / blood thinners. 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 His PCP is Selinda Flavin MD with [facility] in [place].   Today " Christopher Ross" is seen to proceed with TURP. No interval fevers. Most recent UA without infectious parameters.    Past Medical History:  Diagnosis Date  . Dog bite 02/27/2018   RIGHT FINGER LEFT HAND SMALL BITE AREA CLEANED WITH ALCOHOL AND BANDAID APPLIED   . Enlarged prostate   . Hyperlipidemia   . Inguinal hernia   . OA (osteoarthritis) of knee    Right    Past Surgical History:  Procedure Laterality Date  . ANTERIOR CERVICAL DECOMP/DISCECTOMY FUSION  07/30/2012   Procedure: ANTERIOR CERVICAL DECOMPRESSION/DISCECTOMY FUSION 2 LEVELS;  Surgeon: Cristi Loron, MD;  Location: [facility];  Service: Neurosurgery;  Laterality: N/A;  Cervical four-five,Cervical five-six anterior cervical decompression with fusion interbody prothesis plating and bonegraft  . COLONOSCOPY  2015  . FRACTURE SURGERY   YRS AGO   broken left arm, repair  . INGUINAL HERNIA REPAIR Right 09/12/2017   Procedure: LAPAROSCOPIC RIGHT INGUINAL HERNIA REPAIR WITH MESH;  Surgeon: Abigail Miyamoto, MD;  Location: [facility] ORS;  Service: General;  Laterality: Right;  . INSERTION OF MESH Right 09/12/2017   Procedure: INSERTION OF MESH;  Surgeon: Abigail Miyamoto, MD;  Location: [facility] ORS;  Service: General;  Laterality: Right;    History reviewed. No pertinent family history. Social History:  reports that he has never smoked. He has never used smokeless tobacco. He reports that he drinks alcohol. He reports that he does not use drugs.  Allergies: No Known Allergies  No medications prior to admission.    Results for orders placed or performed during the hospital encounter of 02/28/18 (from the past 48 hour(s))  CBC     Status: None   Collection Time: 02/27/18 10:20 AM  Result Value Ref Range   WBC 8.9 4.0 - 10.5 K/uL   RBC 4.73 4.22 - 5.81 MIL/uL   Hemoglobin 14.7 13.0 - 17.0 g/dL   HCT 54.0 98.1 - 19.1 %   MCV 91.8 78.0 - 100.0 fL   MCH 31.1 26.0 - 34.0 pg   MCHC 33.9 30.0 - 36.0 g/dL   RDW 47.8 29.5 - 62.1 %   Platelets 201 150 - 400 K/uL    Comment: Performed at [facility], [address], [place], [place] [address]   No results found.  Review of Systems  Constitutional: Negative.  Negative for chills and fever.  HENT: Negative.  Eyes: Negative.   Respiratory: Negative.   Cardiovascular: Negative.   Gastrointestinal: Negative.   Genitourinary: Positive for frequency and urgency. Negative for hematuria.  Musculoskeletal: Negative.   Skin: Negative.   Neurological: Negative.   Endo/Heme/Allergies: Negative.   Psychiatric/Behavioral: Negative.     There were no vitals taken for this visit. Physical Exam  Constitutional: He appears well-developed.  HENT:  Head: Normocephalic.  Eyes: Pupils are equal, round, and reactive to light.  Neck: Normal range of motion.  Cardiovascular:  Normal rate.  Respiratory: Effort normal.  GI: Soft.  Genitourinary:  Genitourinary Comments: No CVAT  Musculoskeletal: Normal range of motion.  Neurological: He is alert.  Skin: Skin is warm.  Psychiatric: He has a normal mood and affect.     Assessment/Plan  Proceed as planned with TURP. Risks, benefits, alternatives, expected peri-op course with overnight observation and home with foley likely tomorrow discussed previously and reiterated today.   Sebastian AcheMANNY, Tytus Strahle, MD 02/28/2018, 10:28 AM

## 2018-02-28 NOTE — Anesthesia Preprocedure Evaluation (Addendum)
Anesthesia Evaluation  Patient identified by MRN, date of birth, ID band Patient awake    Reviewed: Allergy & Precautions, NPO status , Patient's Chart, lab work & pertinent test results  Airway Mallampati: I  TM Distance: >3 FB Neck ROM: Full    Dental  (+) Teeth Intact, Dental Advisory Given   Pulmonary neg pulmonary ROS,    Pulmonary exam normal breath sounds clear to auscultation       Cardiovascular Exercise Tolerance: Good negative cardio ROS Normal cardiovascular exam Rhythm:Regular Rate:Normal     Neuro/Psych S/p ACDF negative psych ROS   GI/Hepatic negative GI ROS, Neg liver ROS,   Endo/Other  negative endocrine ROS  Renal/GU negative Renal ROS   ENLARGED PROSTATE WITH REFRACTORY VOIDING SYMPTOMS    Musculoskeletal  (+) Arthritis , Osteoarthritis,    Abdominal   Peds  Hematology negative hematology ROS (+)   Anesthesia Other Findings Day of surgery medications reviewed with the patient.  Reproductive/Obstetrics                           Anesthesia Physical Anesthesia Plan  ASA: II  Anesthesia Plan: General   Post-op Pain Management:    Induction: Intravenous  PONV Risk Score and Plan: 3 and Dexamethasone, Ondansetron and Midazolam  Airway Management Planned: LMA  Additional Equipment:   Intra-op Plan:   Post-operative Plan: Extubation in OR  Informed Consent: I have reviewed the patients History and Physical, chart, labs and discussed the procedure including the risks, benefits and alternatives for the proposed anesthesia with the patient or authorized representative who has indicated his/her understanding and acceptance.   Dental advisory given  Plan Discussed with: CRNA  Anesthesia Plan Comments:        Anesthesia Quick Evaluation

## 2018-02-28 NOTE — Transfer of Care (Signed)
Immediate Anesthesia Transfer of Care Note  Patient: Christopher Ross  Procedure(s) Performed: TRANSURETHRAL RESECTION OF THE PROSTATE (TURP) (N/A )  Patient Location: PACU  Anesthesia Type:General  Level of Consciousness: awake, alert  and oriented  Airway & Oxygen Therapy: Patient Spontanous Breathing and Patient connected to face mask oxygen  Post-op Assessment: Report given to RN and Post -op Vital signs reviewed and stable  Post vital signs: Reviewed and stable  Last Vitals:  Vitals Value Taken Time  BP 124/79 02/28/2018  1:15 PM  Temp    Pulse 74 02/28/2018  1:16 PM  Resp    SpO2 100 % 02/28/2018  1:16 PM  Vitals shown include unvalidated device data.  Last Pain:  Vitals:   02/28/18 1135  TempSrc:   PainSc: 0-No pain         Complications: No apparent anesthesia complications

## 2018-02-28 NOTE — Brief Op Note (Signed)
02/28/2018  1:00 PM  PATIENT:  Christopher Ross  62 y.o. male  PRE-OPERATIVE DIAGNOSIS:  ENLARGED PROSTATE WITH REFRACTORY VOIDING SYMPTOMS  POST-OPERATIVE DIAGNOSIS:  ENLARGED PROSTATE WITH REFRACTORY VOIDING SYMPTOMS  PROCEDURE:  Procedure(s): TRANSURETHRAL RESECTION OF THE PROSTATE (TURP) (N/A)  SURGEON:  Surgeon(s) and Role:    * Sebastian AcheManny, Nala Kachel, MD - Primary  PHYSICIAN ASSISTANT:   ASSISTANTS: none   ANESTHESIA:   general  EBL:  50mL   BLOOD ADMINISTERED:none  DRAINS: 83F 3 way foley to NS irrigation   LOCAL MEDICATIONS USED:  NONE  SPECIMEN:  Source of Specimen:  prostate chips  DISPOSITION OF SPECIMEN:  PATHOLOGY  COUNTS:  YES  TOURNIQUET:  * No tourniquets in log *  DICTATION: .Other Dictation: Dictation Number [id number]  PLAN OF CARE: Admit for overnight observation  PATIENT DISPOSITION:  PACU - hemodynamically stable.   Delay start of Pharmacological VTE agent (>24hrs) due to surgical blood loss or risk of bleeding: yes

## 2018-02-28 NOTE — Op Note (Signed)
NAME: Christopher Ross, Christopher Ross MEDICAL RECORD ZO:[id number] ACCOUNT [id number] DATE OF BIRTH:1956/05/28 FACILITY: [facility] LOCATION: [facility] PHYSICIAN:Refugio Mcconico, MD  OPERATIVE REPORT  DATE OF PROCEDURE:  02/28/2018  PREOPERATIVE DIAGNOSIS:  Prostatic hypertrophy, compromised bladder contractility with refractory urinary tract symptoms.  PROCEDURE:  Transection of the prostate.  ESTIMATED BLOOD LOSS:  50 mL.  COMPLICATIONS:  None.  SPECIMENS:  Prostate chips for pathology.  FINDINGS: 1.  Moderate bilobar prostatic hypertrophy kissing lobes pre-resection. 2.  Complete resolution of all obstructing prostatic tissue with a wide-open urinary channel from the bladder neck to the verumontanum post-resection.  DRAINS: 1.  A 22-French 3-way Foley catheter.  2.  Normal saline irrigation, 10 mL of water in the balloon.  Irrigation efflux light pink.  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  He has been medication refractory for some time.  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 Options were discussed for management including strong  anticholinergics and self-cath versus outlet procedure trying to maximally optimize his obstructive component, and he adamantly wished to proceed with outlet procedure.  Various forms were discussed and has relatively modest gland size.  Transurethral  resection versus UroLift were discussed given the superior pressure flow rates with TURP.  We both agreed on this.  Informed consent was obtained and placed in medical record.  PROCEDURE IN DETAIL:  The patient was identified, procedure being transection of the prostate was confirmed.  Serial timeout was performed  and intravenous antibiotics administered.  General anesthesia induced.  The patient was placed into a low lithotomy  position.  A sterile field was created.  I prepped and draped the penis, perineum and proximal thighs using iodine.  Next, urethral calibration was performed using urethral sounds beginning at 22 French up to a 30 [place], and cystourethroscopy was  performed using a 26-French resectoscope sheath with visual obturator.  Inspection of the anterior and posterior urethra revealed moderate bilobar prostatic hypertrophy with kissing lobes pre-resection.  Inspection of the bladder revealed some mild  trabeculation.  Ureteral orifices singleton bilaterally.  No papillary lesions or calcifications noted.  Next, using the bipolar resectoscope loop, very careful systematic resection was performed in a top-down fashion beginning at 12 o'clock position  from the bladder neck to the area of the verumontanum and down to what appeared to be the very superficial fibrous capsule of the prostate.  Next, the right lobe was resected, as was the left, again from the bladder neck to the area of the verumontanum,  taking exquisite care not to undermine the bladder neck, which did not occur.  Prostate chips were irrigated and set aside for permanent pathology.  In the entire base of the resection, the area was fulgurated again.  This resulted in excellent  hemostasis.  There was a wide-open channel now from the area of the bladder neck to the verumontanum.  No evidence of significant capsular perforation.  The resectoscope was exchanged for a 22-French 3-way Foley catheter, 10 mL of sterile water in the  balloon.  This was connected to slow NS irrigation.  The efflux was light pink.  The procedure was terminated.  The patient tolerated the procedure well.  No immediate perineal complications.  The patient was taken to the postanesthesia care in stable condition.  LN/NUANCE  D:02/28/2018 T:02/28/2018  JOB:[id number]

## 2018-03-01 ENCOUNTER — Encounter (HOSPITAL_COMMUNITY): Payer: Self-pay | Admitting: Urology

## 2018-03-01 DIAGNOSIS — N400 Enlarged prostate without lower urinary tract symptoms: Secondary | ICD-10-CM | POA: Diagnosis not present

## 2018-03-01 LAB — HEMOGLOBIN AND HEMATOCRIT, BLOOD
HEMATOCRIT: 38 % — AB (ref 39.0–52.0)
Hemoglobin: 12.7 g/dL — ABNORMAL LOW (ref 13.0–17.0)

## 2018-03-01 MED ORDER — SULFAMETHOXAZOLE-TRIMETHOPRIM 800-160 MG PO TABS: 1.0000 | ORAL_TABLET | Freq: Two times a day (BID) | ORAL | 0 refills | Status: DC

## 2018-03-01 MED ORDER — PROMETHAZINE HCL 25 MG/ML IJ SOLN
25.0000 mg | Freq: Four times a day (QID) | INTRAMUSCULAR | Status: DC | PRN
  Administered 2018-03-01: 25 mg via INTRAVENOUS
  Filled 2018-03-01: qty 1

## 2018-03-01 MED ORDER — OXYBUTYNIN CHLORIDE 5 MG PO TABS: 5.0000 mg | ORAL_TABLET | Freq: Three times a day (TID) | ORAL | 1 refills | Status: DC | PRN

## 2018-03-01 MED ORDER — OXYCODONE-ACETAMINOPHEN 5-325 MG PO TABS: 1.0000 | ORAL_TABLET | Freq: Four times a day (QID) | ORAL | 0 refills | Status: DC | PRN

## 2018-03-01 MED ORDER — SENNOSIDES-DOCUSATE SODIUM 8.6-50 MG PO TABS: 1.0000 | ORAL_TABLET | Freq: Two times a day (BID) | ORAL | 0 refills | Status: DC

## 2018-03-01 NOTE — Discharge Instructions (Signed)
1 - You may have urinary urgency (bladder spasms) and bloody urine on / off with catheter in place. This is normal. ° °2 - Call MD or go to ER for fever >102, severe pain / nausea / vomiting not relieved by medications, or acute change in medical status ° °

## 2018-03-01 NOTE — Progress Notes (Signed)
Discharge instructions reviewed with patient and wife, both verbalized understanding.  Patient ambulatory from [address] east accompanied by wife and all his belongings.

## 2018-03-01 NOTE — Discharge Summary (Signed)
Physician Discharge Summary  Patient ID: Christopher Ross MRN: [id number] DOB/AGE: 64/04/1956 64 y.o.  Admit date: 02/28/2018 Discharge date: 03/01/2018  Admission Diagnoses: Prostatic Hyperplasia  Discharge Diagnoses:  Active Problems:   Prostatic hyperplasia   Discharged Condition: good  Hospital Course: Pt underwent uncomplicated transurethral resection of prostate on 02/28/18, the day of admission, without acute complications. By the afternoon of POD 1 his pain is controlled on PO meds, urine clear off irrigation, maintaining PO hydration, ambulatory, and felt to be adequate for dischage with foley in . Hgb 12.7, path pending at discharge.   Consults: None  Significant Diagnostic Studies: labs: as per above.   Treatments: surgery: as per above.    Discharge Exam: Blood pressure 100/61, pulse 61, temperature 97.7 F (36.5 C), temperature source Oral, resp. rate 18, height 5\' 10"  (1.778 m), weight 78 kg (172 lb), SpO2 97 %. General appearance: alert, cooperative and appears stated age Eyes: negative Nose: Nares normal. Septum midline. Mucosa normal. No drainage or sinus tenderness. Throat: lips, mucosa, and tongue normal; teeth and gums normal Neck: supple, symmetrical, trachea midline Back: symmetric, no curvature. ROM normal. No CVA tenderness. Resp: non-labored on room air.  Cardio: Nl rate GI: soft, non-tender; bowel sounds normal; no masses,  no organomegaly Male genitalia: normal, 3 way foley in place with medium yellow urine OFF irrigation x 4 hours.  Extremities: extremities normal, atraumatic, no cyanosis or edema Pulses: 2+ and symmetric Skin: Skin color, texture, turgor normal. No rashes or lesions Lymph nodes: Cervical, supraclavicular, and axillary nodes normal. Neurologic: Grossly normal  Disposition:    Allergies as of 03/01/2018   No Known Allergies     Medication List    TAKE these medications   FISH OIL PO Take 1 capsule by mouth 3 (three) times  daily.   oxybutynin 5 MG tablet Commonly known as:  DITROPAN Take 1 tablet (5 mg total) by mouth every 8 (eight) hours as needed for bladder spasms. Or nausea.   oxyCODONE-acetaminophen 5-325 MG tablet Commonly known as:  PERCOCET Take 1-2 tablets by mouth every 6 (six) hours as needed for moderate pain or severe pain. Post-operatively   senna-docusate 8.6-50 MG tablet Commonly known as:  Senokot-S Take 1 tablet by mouth 2 (two) times daily. While taking pain meds to prevent constipation   simvastatin 10 MG tablet Commonly known as:  ZOCOR Take 10 mg by mouth daily at 6 PM.   sulfamethoxazole-trimethoprim 800-160 MG tablet Commonly known as:  BACTRIM DS,SEPTRA DS Take 1 tablet by mouth 2 (two) times daily. X 4 days to prevent infection   zolpidem 5 MG tablet Commonly known as:  AMBIEN Take 2.5 mg by mouth at bedtime.      Follow-up Information    Sebastian AcheManny, Jaydalynn Olivero, MD On 03/05/2018.   Specialty:  Urology Why:  at 10 AM for MD visit and office catheter removal Contact information: [address] [place] [address] [phone]           Signed: Sebastian AcheMANNY, Tramon Crescenzo 03/01/2018, 1:26 PM

## 2018-03-07 ENCOUNTER — Other Ambulatory Visit (INDEPENDENT_AMBULATORY_CARE_PROVIDER_SITE_OTHER): Payer: Self-pay | Admitting: Orthopedic Surgery

## 2018-03-07 DIAGNOSIS — M1711 Unilateral primary osteoarthritis, right knee: Secondary | ICD-10-CM

## 2018-03-19 ENCOUNTER — Other Ambulatory Visit (HOSPITAL_COMMUNITY): Payer: Self-pay

## 2018-03-20 ENCOUNTER — Encounter (HOSPITAL_COMMUNITY): Payer: Self-pay

## 2018-03-20 ENCOUNTER — Encounter (HOSPITAL_COMMUNITY)
Admission: RE | Admit: 2018-03-20 | Discharge: 2018-03-20 | Disposition: A | Discharge status: Home / Self Care | Payer: BC Managed Care – PPO | Source: Ambulatory Visit | Attending: Orthopedic Surgery | Admitting: Orthopedic Surgery

## 2018-03-20 ENCOUNTER — Other Ambulatory Visit: Payer: Self-pay

## 2018-03-20 DIAGNOSIS — Z01812 Encounter for preprocedural laboratory examination: Secondary | ICD-10-CM | POA: Insufficient documentation

## 2018-03-20 HISTORY — DX: Presence of spectacles and contact lenses: Z97.3

## 2018-03-20 LAB — CBC
HEMATOCRIT: 43.1 % (ref 39.0–52.0)
HEMOGLOBIN: 13.9 g/dL (ref 13.0–17.0)
MCH: 30.5 pg (ref 26.0–34.0)
MCHC: 32.3 g/dL (ref 30.0–36.0)
MCV: 94.5 fL (ref 78.0–100.0)
Platelets: 263 10*3/uL (ref 150–400)
RBC: 4.56 MIL/uL (ref 4.22–5.81)
RDW: 12.7 % (ref 11.5–15.5)
WBC: 10 10*3/uL (ref 4.0–10.5)

## 2018-03-20 LAB — BASIC METABOLIC PANEL
Anion gap: 9 (ref 5–15)
BUN: 21 mg/dL (ref 8–23)
CHLORIDE: 104 mmol/L (ref 98–111)
CO2: 26 mmol/L (ref 22–32)
Calcium: 9.4 mg/dL (ref 8.9–10.3)
Creatinine, Ser: 1.13 mg/dL (ref 0.61–1.24)
GFR calc non Af Amer: >60 mL/min (ref >60)
Glucose, Bld: 90 mg/dL (ref 70–99)
POTASSIUM: 3.9 mmol/L (ref 3.5–5.1)
SODIUM: 139 mmol/L (ref 135–145)

## 2018-03-20 LAB — SURGICAL PCR SCREEN
MRSA, PCR: NEGATIVE
STAPHYLOCOCCUS AUREUS: NEGATIVE

## 2018-03-20 LAB — TYPE AND SCREEN
ABO/RH(D): O POS
Antibody Screen: NEGATIVE

## 2018-03-20 LAB — URINALYSIS, ROUTINE W REFLEX MICROSCOPIC
Bacteria, UA: NONE SEEN
Bilirubin Urine: NEGATIVE
GLUCOSE, UA: NEGATIVE mg/dL
KETONES UR: NEGATIVE mg/dL
Nitrite: NEGATIVE
PH: 7 (ref 5.0–8.0)
Protein, ur: NEGATIVE mg/dL
Specific Gravity, Urine: 1.02 (ref 1.005–1.030)

## 2018-03-20 LAB — ABO/RH: ABO/RH(D): O POS

## 2018-03-20 NOTE — Progress Notes (Signed)
Pt denies SOB, chest pain, and being under the care of a cardiologist. Pt denies having a stress test, echo and cardiac cath. Pt denies having an EKG and chest x ray within the last year. Pt denies recent labs. Pt stated that he does not take Aspirin.

## 2018-03-20 NOTE — Pre-Procedure Instructions (Signed)
   Christopher Ross  03/20/2018     [facility] - [place], [place] - [address] [place] [address] Phone: [phone] Fax: [phone]    Your procedure is scheduled on Tuesday, April 01, 2018  [facility] Admitting at 5:30 A.M.  Call this number if you have problems the morning of surgery:  [phone]   Remember: Follow your surgeon's instructions regarding Aspirin.  Do not eat or drink after midnight Monday, March 31, 2018   Take these medicines the morning of surgery with A SIP OF WATER: tamsulosin (FLOMAX)  Stop taking vitamins, fish oil and herbal medications. Do not take any NSAIDs ie: Ibuprofen, Advil, Naproxen (Aleve), Motrin, BC and Goody Powder; stop 7 days prior to surgery.  Do not wear jewelry, make-up or nail polish.  Do not wear lotions, powders, or perfumes, or deodorant.  Do not shave 48 hours prior to surgery.  Men may shave face and neck.  Do not bring valuables to the hospital.  [facility] is not responsible for any belongings or valuables.  Contacts, dentures or bridgework may not be worn into surgery.  Leave your suitcase in the car.  After surgery it may be brought to your room Special instructions: Shower the night before and day of surgery with CHG. Please read over the following fact sheets that you were given. Pain Booklet, Coughing and Deep Breathing, MRSA Information and Surgical Site Infection Prevention

## 2018-03-21 ENCOUNTER — Other Ambulatory Visit (HOSPITAL_COMMUNITY): Payer: BC Managed Care – PPO

## 2018-03-21 LAB — URINE CULTURE: CULTURE: NO GROWTH

## 2018-03-24 ENCOUNTER — Other Ambulatory Visit (HOSPITAL_COMMUNITY): Payer: BC Managed Care – PPO

## 2018-03-31 ENCOUNTER — Telehealth (INDEPENDENT_AMBULATORY_CARE_PROVIDER_SITE_OTHER): Payer: Self-pay | Admitting: Orthopedic Surgery

## 2018-03-31 MED ORDER — TRANEXAMIC ACID 1000 MG/10ML IV SOLN
1000.0000 mg | INTRAVENOUS | Status: AC
  Administered 2018-04-01: 1000 mg via INTRAVENOUS
  Filled 2018-03-31: qty 1100

## 2018-03-31 NOTE — Telephone Encounter (Signed)
I called Harrold Donathathan with [employer] and left message on his voice mail @ 11:11am, along with patient's call back number. Also left a message with Kipp BroodBrent.

## 2018-03-31 NOTE — H&P (Signed)
TOTAL KNEE ADMISSION H&P  Patient is being admitted for right total knee arthroplasty.  Subjective:  Chief Complaint:right knee pain.  HPI: Christopher Ross, 62 y.o. male, has a history of pain and functional disability in the right knee due to arthritis and has failed non-surgical conservative treatments for greater than 12 weeks to includeNSAID's and/or analgesics, corticosteriod injections and activity modification.  Onset of symptoms was abrupt, starting Less than 1 year ago years ago with rapidlly worsening course since that time. The patient noted no past surgery on the right knee(s).  Patient currently rates pain in the right knee(s) at 8 out of 10 with activity. Patient has night pain, worsening of pain with activity and weight bearing, pain that interferes with activities of daily living, pain with passive range of motion and crepitus.  Patient has evidence of subchondral cysts, subchondral sclerosis, periarticular osteophytes and joint space narrowing by imaging studies. This patient has had Rapidly progressive loss of motion in the right knee.  No family or personal history of DVT or pulmonary embolism.. There is no active infection.  Patient Active Problem List   Diagnosis Date Noted  . Prostatic hyperplasia 02/28/2018   Past Medical History:  Diagnosis Date  . Dog bite 02/27/2018   RIGHT FINGER LEFT HAND SMALL BITE AREA CLEANED WITH ALCOHOL AND BANDAID APPLIED   . Enlarged prostate   . Hyperlipidemia   . Inguinal hernia   . OA (osteoarthritis) of knee    Right  . OA (osteoarthritis) of knee    right  . Wears glasses     Past Surgical History:  Procedure Laterality Date  . ANTERIOR CERVICAL DECOMP/DISCECTOMY FUSION  07/30/2012   Procedure: ANTERIOR CERVICAL DECOMPRESSION/DISCECTOMY FUSION 2 LEVELS;  Surgeon: Cristi Loron, MD;  Location: [facility];  Service: Neurosurgery;  Laterality: N/A;  Cervical four-five,Cervical five-six anterior cervical decompression with  fusion interbody prothesis plating and bonegraft  . COLONOSCOPY  2015  . FRACTURE SURGERY  YRS AGO   broken left arm, repair  . INGUINAL HERNIA REPAIR Right 09/12/2017   Procedure: LAPAROSCOPIC RIGHT INGUINAL HERNIA REPAIR WITH MESH;  Surgeon: Abigail Miyamoto, MD;  Location: [facility];  Service: General;  Laterality: Right;  . INSERTION OF MESH Right 09/12/2017   Procedure: INSERTION OF MESH;  Surgeon: Abigail Miyamoto, MD;  Location: [facility];  Service: General;  Laterality: Right;  . TRANSURETHRAL RESECTION OF PROSTATE N/A 02/28/2018   Procedure: TRANSURETHRAL RESECTION OF THE PROSTATE (TURP);  Surgeon: Sebastian Ache, MD;  Location: [facility];  Service: Urology;  Laterality: N/A;    Current Facility-Administered Medications  Medication Dose Route Frequency Provider Last Rate Last Dose  . [START ON 04/01/2018] tranexamic acid (CYKLOKAPRON) 1,000 mg in sodium chloride 0.9 % 100 mL IVPB  1,000 mg Intravenous To OR August Saucer Corrie Mckusick, MD       Current Outpatient Medications  Medication Sig Dispense Refill Last Dose  . Eszopiclone (ESZOPICLONE) 3 MG TABS Take 3 mg by mouth at bedtime. Take immediately before bedtime     . Meth-Hyo-M Bl-Na Phos-Ph Sal (URO-MP) 118 MG CAPS Take 118 mg by mouth 3 (three) times daily.     Marland Kitchen oxybutynin (DITROPAN) 5 MG tablet Take 1 tablet (5 mg total) by mouth every 8 (eight) hours as needed for bladder spasms. Or nausea. 20 tablet 1   . simvastatin (ZOCOR) 10 MG tablet Take 10 mg by mouth daily at 6 PM.    02/27/2018 at Unknown time  . tamsulosin (FLOMAX) 0.4 MG  CAPS capsule Take 0.4 mg by mouth daily.     Marland Kitchen. oxyCODONE-acetaminophen (PERCOCET) 5-325 MG tablet Take 1-2 tablets by mouth every 6 (six) hours as needed for moderate pain or severe pain. Post-operatively (Patient not taking: Reported on 03/19/2018) 20 tablet 0 Not Taking at Unknown time  . senna-docusate (SENOKOT-S) 8.6-50 MG tablet Take 1 tablet by mouth 2 (two) times daily. While taking pain meds to prevent  constipation (Patient not taking: Reported on 03/19/2018) 20 tablet 0 Not Taking at Unknown time   No Known Allergies  Social History   Tobacco Use  . Smoking status: Never Smoker  . Smokeless tobacco: Never Used  Substance Use Topics  . Alcohol use: Yes    Comment: occasional     No family history on file.   Review of Systems  Musculoskeletal: Positive for joint pain.  All other systems reviewed and are negative.   Objective:  Physical Exam  Constitutional: He appears well-developed.  HENT:  Head: Normocephalic.  Eyes: Pupils are equal, round, and reactive to light.  Neck: Normal range of motion.  Cardiovascular: Normal rate.  Respiratory: Effort normal.  Neurological: He is alert.  Skin: Skin is warm.  Psychiatric: He has a normal mood and affect.   Patient has a 12 degree flexion contracture and can bend to no groin pain on the right-hand side to 90 degrees.  Extensor mechanism is intact.  Collateral and cruciate ligaments are stable.  Pedal pulses palpable.  Excellent quad tone bilaterally.  No effusion or skin changes noted in the right knee area. Vital signs in last 24 hours:    Labs:   Estimated body mass index is 24.39 kg/m as calculated from the following:   Height as of 03/20/18: 5\' 10"  (1.778 m).   Weight as of 03/20/18: 170 lb (77.1 kg).   Imaging Review Plain radiographs demonstrate moderate degenerative joint disease of the right knee(s). The overall alignment isneutral. The bone quality appears to be good for age and reported activity level.   Preoperative templating of the joint replacement has been completed, documented, and submitted to the Operating Room personnel in order to optimize intra-operative equipment management.   Anticipated LOS equal to or greater than 2 midnights due to - Age 64 and older with one or more of the following:  - Obesity  - Expected need for hospital services (PT, OT, Nursing) required for safe  discharge  -  Anticipated need for postoperative skilled nursing care or inpatient rehab  - Active co-morbidities: None OR   - Unanticipated findings during/Post Surgery: None  - Patient is a high risk of re-admission due to: None     Assessment/Plan:  End stage arthritis, right knee   The patient history, physical examination, clinical judgment of the provider and imaging studies are consistent with end stage degenerative joint disease of the right knee(s) and total knee arthroplasty is deemed medically necessary. The treatment options including medical management, injection therapy arthroscopy and arthroplasty were discussed at length. The risks and benefits of total knee arthroplasty were presented and reviewed. The risks due to aseptic loosening, infection, stiffness, patella tracking problems, thromboembolic complications and other imponderables were discussed. The patient acknowledged the explanation, agreed to proceed with the plan and consent was signed. Patient is being admitted for inpatient treatment for surgery, pain control, PT, OT, prophylactic antibiotics, VTE prophylaxis, progressive ambulation and ADL's and discharge planning. The patient is planning to be discharged home with home health services

## 2018-03-31 NOTE — Telephone Encounter (Signed)
Patient's wife called stating that they are suppose to meet with a PT to bring the machine to stretch her husband's leg after surgery.  She is needing the number to get in contact with him so that they can change the time of the meeting.  CB#[phone].  Thank you.

## 2018-03-31 NOTE — Telephone Encounter (Signed)
Can you please call Kipp BroodBrent?  See message below.

## 2018-04-01 ENCOUNTER — Encounter (HOSPITAL_COMMUNITY)
Admission: RE | Disposition: A | Discharge status: Home Health | Payer: Self-pay | Source: Ambulatory Visit | Attending: Orthopedic Surgery

## 2018-04-01 ENCOUNTER — Other Ambulatory Visit: Payer: Self-pay

## 2018-04-01 ENCOUNTER — Inpatient Hospital Stay (HOSPITAL_COMMUNITY)
Admission: RE | Admit: 2018-04-01 | Discharge: 2018-04-04 | DRG: 470 | Disposition: A | Discharge status: Home Health | Payer: BC Managed Care – PPO | Source: Ambulatory Visit | Attending: Orthopedic Surgery | Admitting: Orthopedic Surgery

## 2018-04-01 ENCOUNTER — Inpatient Hospital Stay (HOSPITAL_COMMUNITY): Payer: BC Managed Care – PPO | Admitting: Certified Registered Nurse Anesthetist

## 2018-04-01 ENCOUNTER — Encounter (HOSPITAL_COMMUNITY): Payer: Self-pay | Admitting: *Deleted

## 2018-04-01 DIAGNOSIS — M1712 Unilateral primary osteoarthritis, left knee: Secondary | ICD-10-CM | POA: Diagnosis present

## 2018-04-01 DIAGNOSIS — E785 Hyperlipidemia, unspecified: Secondary | ICD-10-CM | POA: Diagnosis present

## 2018-04-01 DIAGNOSIS — Z981 Arthrodesis status: Secondary | ICD-10-CM | POA: Diagnosis not present

## 2018-04-01 DIAGNOSIS — M1710 Unilateral primary osteoarthritis, unspecified knee: Secondary | ICD-10-CM | POA: Diagnosis present

## 2018-04-01 DIAGNOSIS — E669 Obesity, unspecified: Secondary | ICD-10-CM | POA: Diagnosis present

## 2018-04-01 DIAGNOSIS — Z9079 Acquired absence of other genital organ(s): Secondary | ICD-10-CM | POA: Diagnosis not present

## 2018-04-01 DIAGNOSIS — R52 Pain, unspecified: Secondary | ICD-10-CM

## 2018-04-01 DIAGNOSIS — N400 Enlarged prostate without lower urinary tract symptoms: Secondary | ICD-10-CM | POA: Diagnosis present

## 2018-04-01 DIAGNOSIS — Z6824 Body mass index (BMI) 24.0-24.9, adult: Secondary | ICD-10-CM | POA: Diagnosis not present

## 2018-04-01 DIAGNOSIS — M1711 Unilateral primary osteoarthritis, right knee: Secondary | ICD-10-CM | POA: Diagnosis present

## 2018-04-01 DIAGNOSIS — Z973 Presence of spectacles and contact lenses: Secondary | ICD-10-CM

## 2018-04-01 DIAGNOSIS — Z79899 Other long term (current) drug therapy: Secondary | ICD-10-CM

## 2018-04-01 HISTORY — PX: TOTAL KNEE ARTHROPLASTY: SHX125

## 2018-04-01 SURGERY — ARTHROPLASTY, KNEE, TOTAL
Anesthesia: Spinal | Laterality: Right

## 2018-04-01 MED ORDER — TRANEXAMIC ACID 1000 MG/10ML IV SOLN
INTRAVENOUS | Status: DC | PRN
  Administered 2018-04-01: 2000 mg via TOPICAL

## 2018-04-01 MED ORDER — ACETAMINOPHEN 325 MG PO TABS: 325.0000 mg | ORAL_TABLET | Freq: Four times a day (QID) | ORAL | Status: DC | PRN

## 2018-04-01 MED ORDER — ONDANSETRON HCL 4 MG/2ML IJ SOLN
4.0000 mg | Freq: Four times a day (QID) | INTRAMUSCULAR | Status: DC | PRN
  Administered 2018-04-01 – 2018-04-03 (×3): 4 mg via INTRAVENOUS
  Filled 2018-04-01 (×3): qty 2

## 2018-04-01 MED ORDER — ROPIVACAINE HCL 5 MG/ML IJ SOLN
INTRAMUSCULAR | Status: DC | PRN
  Administered 2018-04-01: 30 mL via PERINEURAL

## 2018-04-01 MED ORDER — METOCLOPRAMIDE HCL 5 MG PO TABS
5.0000 mg | ORAL_TABLET | Freq: Three times a day (TID) | ORAL | Status: DC | PRN
  Administered 2018-04-02: 10 mg via ORAL
  Filled 2018-04-01: qty 2

## 2018-04-01 MED ORDER — PHENYLEPHRINE HCL 10 MG/ML IJ SOLN: INTRAMUSCULAR | Status: DC | PRN

## 2018-04-01 MED ORDER — SIMVASTATIN 10 MG PO TABS
10.0000 mg | ORAL_TABLET | Freq: Every day | ORAL | Status: DC
  Administered 2018-04-01 – 2018-04-03 (×3): 10 mg via ORAL
  Filled 2018-04-01 (×3): qty 1

## 2018-04-01 MED ORDER — MIDAZOLAM HCL 5 MG/5ML IJ SOLN
INTRAMUSCULAR | Status: DC | PRN
  Administered 2018-04-01: 2 mg via INTRAVENOUS

## 2018-04-01 MED ORDER — BUPIVACAINE LIPOSOME 1.3 % IJ SUSP
20.0000 mL | Freq: Once | INTRAMUSCULAR | Status: DC
  Filled 2018-04-01: qty 20

## 2018-04-01 MED ORDER — MORPHINE SULFATE (PF) 4 MG/ML IV SOLN
INTRAVENOUS | Status: AC
  Filled 2018-04-01: qty 2

## 2018-04-01 MED ORDER — METOCLOPRAMIDE HCL 5 MG/ML IJ SOLN
5.0000 mg | Freq: Three times a day (TID) | INTRAMUSCULAR | Status: DC | PRN
  Administered 2018-04-01: 10 mg via INTRAVENOUS
  Filled 2018-04-01: qty 2

## 2018-04-01 MED ORDER — ZOLPIDEM TARTRATE 5 MG PO TABS
5.0000 mg | ORAL_TABLET | Freq: Every evening | ORAL | Status: DC | PRN
  Administered 2018-04-04: 5 mg via ORAL
  Filled 2018-04-01 (×2): qty 1

## 2018-04-01 MED ORDER — CEFAZOLIN SODIUM-DEXTROSE 2-4 GM/100ML-% IV SOLN
INTRAVENOUS | Status: AC
  Filled 2018-04-01: qty 100

## 2018-04-01 MED ORDER — SODIUM CHLORIDE 0.9 % IR SOLN
Status: DC | PRN
Start: 2018-04-01 — End: 2018-04-01
  Administered 2018-04-01: 3000 mL

## 2018-04-01 MED ORDER — CHLORHEXIDINE GLUCONATE 4 % EX LIQD: 60.0000 mL | Freq: Once | CUTANEOUS | Status: DC

## 2018-04-01 MED ORDER — TRANEXAMIC ACID 1000 MG/10ML IV SOLN
2000.0000 mg | Freq: Once | INTRAVENOUS | Status: DC
  Filled 2018-04-01: qty 20

## 2018-04-01 MED ORDER — PROPOFOL 500 MG/50ML IV EMUL
INTRAVENOUS | Status: DC | PRN
  Administered 2018-04-01: 75 ug/kg/min via INTRAVENOUS

## 2018-04-01 MED ORDER — OXYCODONE HCL 5 MG PO TABS
5.0000 mg | ORAL_TABLET | ORAL | Status: DC | PRN
  Administered 2018-04-01 – 2018-04-04 (×11): 10 mg via ORAL
  Filled 2018-04-01 (×11): qty 2

## 2018-04-01 MED ORDER — PROPOFOL 10 MG/ML IV BOLUS
INTRAVENOUS | Status: AC
  Filled 2018-04-01: qty 20

## 2018-04-01 MED ORDER — BUPIVACAINE LIPOSOME 1.3 % IJ SUSP
INTRAMUSCULAR | Status: DC | PRN
  Administered 2018-04-01: 20 mL

## 2018-04-01 MED ORDER — CLONIDINE HCL (ANALGESIA) 100 MCG/ML EP SOLN
EPIDURAL | Status: DC | PRN
  Administered 2018-04-01: 1 mL

## 2018-04-01 MED ORDER — SODIUM CHLORIDE 0.9% FLUSH
INTRAVENOUS | Status: DC | PRN
  Administered 2018-04-01: 20 mL

## 2018-04-01 MED ORDER — FENTANYL CITRATE (PF) 250 MCG/5ML IJ SOLN
INTRAMUSCULAR | Status: AC
  Filled 2018-04-01: qty 5

## 2018-04-01 MED ORDER — SODIUM CHLORIDE 0.9 % IV SOLN
INTRAVENOUS | Status: DC | PRN
  Administered 2018-04-01: 25 ug/min via INTRAVENOUS

## 2018-04-01 MED ORDER — CLONIDINE HCL (ANALGESIA) 100 MCG/ML EP SOLN
150.0000 ug | EPIDURAL | Status: DC
  Filled 2018-04-01: qty 10

## 2018-04-01 MED ORDER — ONDANSETRON HCL 4 MG/2ML IJ SOLN
INTRAMUSCULAR | Status: DC | PRN
  Administered 2018-04-01: 4 mg via INTRAVENOUS

## 2018-04-01 MED ORDER — FENTANYL CITRATE (PF) 100 MCG/2ML IJ SOLN: 25.0000 ug | INTRAMUSCULAR | Status: DC | PRN

## 2018-04-01 MED ORDER — ASPIRIN 81 MG PO CHEW
81.0000 mg | CHEWABLE_TABLET | Freq: Two times a day (BID) | ORAL | Status: DC
  Administered 2018-04-01 – 2018-04-04 (×6): 81 mg via ORAL
  Filled 2018-04-01 (×6): qty 1

## 2018-04-01 MED ORDER — URO-MP 118 MG PO CAPS: 118.0000 mg | ORAL_CAPSULE | Freq: Three times a day (TID) | ORAL | Status: DC

## 2018-04-01 MED ORDER — MENTHOL 3 MG MT LOZG: 1.0000 | LOZENGE | OROMUCOSAL | Status: DC | PRN

## 2018-04-01 MED ORDER — OXYBUTYNIN CHLORIDE 5 MG PO TABS: 5.0000 mg | ORAL_TABLET | Freq: Three times a day (TID) | ORAL | Status: DC | PRN

## 2018-04-01 MED ORDER — LACTATED RINGERS IV SOLN: INTRAVENOUS | Status: DC

## 2018-04-01 MED ORDER — ONDANSETRON HCL 4 MG PO TABS
4.0000 mg | ORAL_TABLET | Freq: Four times a day (QID) | ORAL | Status: DC | PRN
  Administered 2018-04-03: 4 mg via ORAL
  Filled 2018-04-01: qty 1

## 2018-04-01 MED ORDER — METHOCARBAMOL 1000 MG/10ML IJ SOLN
500.0000 mg | Freq: Four times a day (QID) | INTRAVENOUS | Status: DC | PRN
  Filled 2018-04-01: qty 5

## 2018-04-01 MED ORDER — HYDROMORPHONE HCL 1 MG/ML IJ SOLN
0.5000 mg | INTRAMUSCULAR | Status: DC | PRN
  Administered 2018-04-01 – 2018-04-02 (×6): 1 mg via INTRAVENOUS
  Filled 2018-04-01 (×9): qty 1

## 2018-04-01 MED ORDER — CEFAZOLIN SODIUM-DEXTROSE 1-4 GM/50ML-% IV SOLN
1.0000 g | Freq: Four times a day (QID) | INTRAVENOUS | Status: AC
  Administered 2018-04-01 (×2): 1 g via INTRAVENOUS
  Filled 2018-04-01 (×2): qty 50

## 2018-04-01 MED ORDER — FENTANYL CITRATE (PF) 100 MCG/2ML IJ SOLN
INTRAMUSCULAR | Status: DC | PRN
  Administered 2018-04-01: 100 ug via INTRAVENOUS

## 2018-04-01 MED ORDER — METHOCARBAMOL 500 MG PO TABS
500.0000 mg | ORAL_TABLET | Freq: Four times a day (QID) | ORAL | Status: DC | PRN
  Administered 2018-04-01 – 2018-04-03 (×6): 500 mg via ORAL
  Filled 2018-04-01 (×6): qty 1

## 2018-04-01 MED ORDER — 0.9 % SODIUM CHLORIDE (POUR BTL) OPTIME
TOPICAL | Status: DC | PRN
  Administered 2018-04-01: 1000 mL

## 2018-04-01 MED ORDER — MEPERIDINE HCL 50 MG/ML IJ SOLN: 6.2500 mg | INTRAMUSCULAR | Status: DC | PRN

## 2018-04-01 MED ORDER — BUPIVACAINE HCL (PF) 0.25 % IJ SOLN
INTRAMUSCULAR | Status: AC
  Filled 2018-04-01: qty 30

## 2018-04-01 MED ORDER — EPHEDRINE SULFATE 50 MG/ML IJ SOLN
INTRAMUSCULAR | Status: DC | PRN
  Administered 2018-04-01 (×3): 10 mg via INTRAVENOUS

## 2018-04-01 MED ORDER — MIDAZOLAM HCL 2 MG/2ML IJ SOLN
INTRAMUSCULAR | Status: AC
  Filled 2018-04-01: qty 2

## 2018-04-01 MED ORDER — CLONIDINE HCL (ANALGESIA) 100 MCG/ML EP SOLN
EPIDURAL | Status: DC | PRN
  Administered 2018-04-01: 100 ug

## 2018-04-01 MED ORDER — PROPOFOL 10 MG/ML IV BOLUS
INTRAVENOUS | Status: DC | PRN
  Administered 2018-04-01: 20 mg via INTRAVENOUS

## 2018-04-01 MED ORDER — MORPHINE SULFATE (PF) 4 MG/ML IV SOLN
INTRAVENOUS | Status: DC | PRN
  Administered 2018-04-01: 8 mg via SUBCUTANEOUS

## 2018-04-01 MED ORDER — LACTATED RINGERS IV SOLN
INTRAVENOUS | Status: DC | PRN
  Administered 2018-04-01 (×2): via INTRAVENOUS

## 2018-04-01 MED ORDER — CEFAZOLIN SODIUM-DEXTROSE 2-4 GM/100ML-% IV SOLN
2.0000 g | INTRAVENOUS | Status: AC
  Administered 2018-04-01: 2 g via INTRAVENOUS

## 2018-04-01 MED ORDER — DOCUSATE SODIUM 100 MG PO CAPS
100.0000 mg | ORAL_CAPSULE | Freq: Two times a day (BID) | ORAL | Status: DC
  Administered 2018-04-01 – 2018-04-04 (×7): 100 mg via ORAL
  Filled 2018-04-01 (×7): qty 1

## 2018-04-01 MED ORDER — PHENOL 1.4 % MT LIQD: 1.0000 | OROMUCOSAL | Status: DC | PRN

## 2018-04-01 MED ORDER — TAMSULOSIN HCL 0.4 MG PO CAPS
0.4000 mg | ORAL_CAPSULE | Freq: Every day | ORAL | Status: DC
  Administered 2018-04-01 – 2018-04-04 (×4): 0.4 mg via ORAL
  Filled 2018-04-01 (×4): qty 1

## 2018-04-01 MED ORDER — TRAMADOL HCL 50 MG PO TABS
50.0000 mg | ORAL_TABLET | Freq: Four times a day (QID) | ORAL | Status: DC
  Administered 2018-04-01 – 2018-04-04 (×9): 50 mg via ORAL
  Filled 2018-04-01 (×9): qty 1

## 2018-04-01 MED ORDER — METOCLOPRAMIDE HCL 5 MG/ML IJ SOLN: 10.0000 mg | Freq: Once | INTRAMUSCULAR | Status: DC | PRN

## 2018-04-01 SURGICAL SUPPLY — 77 items
BAG DECANTER FOR FLEXI CONT (MISCELLANEOUS) ×3 IMPLANT
BANDAGE ELASTIC 6 VELCRO ST LF (GAUZE/BANDAGES/DRESSINGS) ×2 IMPLANT
BANDAGE ESMARK 6X9 LF (GAUZE/BANDAGES/DRESSINGS) ×1 IMPLANT
BLADE SAG 18X100X1.27 (BLADE) ×3 IMPLANT
BLADE SAW SGTL 13.0X1.19X90.0M (BLADE) IMPLANT
BNDG CMPR 9X6 STRL LF SNTH (GAUZE/BANDAGES/DRESSINGS) ×1
BNDG CMPR MED 15X6 ELC VLCR LF (GAUZE/BANDAGES/DRESSINGS) ×1
BNDG COHESIVE 6X5 TAN STRL LF (GAUZE/BANDAGES/DRESSINGS) ×3 IMPLANT
BNDG ELASTIC 6X15 VLCR STRL LF (GAUZE/BANDAGES/DRESSINGS) ×3 IMPLANT
BNDG ESMARK 6X9 LF (GAUZE/BANDAGES/DRESSINGS) ×3
BOWL SMART MIX CTS (DISPOSABLE) IMPLANT
BSPLAT TIB 6 KN TRITANIUM (Knees) ×1 IMPLANT
CLOSURE WOUND 1/2 X4 (GAUZE/BANDAGES/DRESSINGS) ×2
COMPONENT TRI CR FEM SZ5 KNEE (Orthopedic Implant) IMPLANT
CONT SPECI 4OZ STER CLIK (MISCELLANEOUS) ×3 IMPLANT
COVER SURGICAL LIGHT HANDLE (MISCELLANEOUS) ×3 IMPLANT
CUFF TOURNIQUET SINGLE 34IN LL (TOURNIQUET CUFF) ×3 IMPLANT
CUFF TOURNIQUET SINGLE 44IN (TOURNIQUET CUFF) IMPLANT
DECANTER SPIKE VIAL GLASS SM (MISCELLANEOUS) ×3 IMPLANT
DRAPE INCISE IOBAN 66X45 STRL (DRAPES) IMPLANT
DRAPE ORTHO SPLIT 77X108 STRL (DRAPES) ×9
DRAPE SURG ORHT 6 SPLT 77X108 (DRAPES) ×3 IMPLANT
DRAPE U-SHAPE 47X51 STRL (DRAPES) ×3 IMPLANT
DRSG AQUACEL AG ADV 3.5X14 (GAUZE/BANDAGES/DRESSINGS) ×2 IMPLANT
DURAPREP 26ML APPLICATOR (WOUND CARE) ×6 IMPLANT
ELECT CAUTERY BLADE 6.4 (BLADE) ×3 IMPLANT
ELECT REM PT RETURN 9FT ADLT (ELECTROSURGICAL) ×3
ELECTRODE REM PT RTRN 9FT ADLT (ELECTROSURGICAL) ×1 IMPLANT
GAUZE SPONGE 4X4 12PLY STRL (GAUZE/BANDAGES/DRESSINGS) ×3 IMPLANT
GLOVE BIOGEL PI IND STRL 7.5 (GLOVE) ×1 IMPLANT
GLOVE BIOGEL PI IND STRL 8 (GLOVE) ×1 IMPLANT
GLOVE BIOGEL PI INDICATOR 7.5 (GLOVE) ×2
GLOVE BIOGEL PI INDICATOR 8 (GLOVE) ×2
GLOVE ECLIPSE 7.0 STRL STRAW (GLOVE) ×3 IMPLANT
GLOVE SURG ORTHO 8.0 STRL STRW (GLOVE) ×3 IMPLANT
GOWN STRL REUS W/ TWL LRG LVL3 (GOWN DISPOSABLE) ×3 IMPLANT
GOWN STRL REUS W/TWL LRG LVL3 (GOWN DISPOSABLE) ×9
HANDPIECE INTERPULSE COAX TIP (DISPOSABLE) ×3
HOOD PEEL AWAY FLYTE STAYCOOL (MISCELLANEOUS) ×9 IMPLANT
IMMOBILIZER KNEE 20 (SOFTGOODS) IMPLANT
IMMOBILIZER KNEE 22 UNIV (SOFTGOODS) ×2 IMPLANT
IMMOBILIZER KNEE 24 THIGH 36 (MISCELLANEOUS) IMPLANT
IMMOBILIZER KNEE 24 UNIV (MISCELLANEOUS)
INSERT KNEE TIB BRG 6 (Insert) ×2 IMPLANT
KIT BASIN OR (CUSTOM PROCEDURE TRAY) ×3 IMPLANT
KIT TURNOVER KIT B (KITS) ×3 IMPLANT
KNEE TIBIAL COMPONENT SZ6 (Knees) ×2 IMPLANT
MANIFOLD NEPTUNE II (INSTRUMENTS) ×3 IMPLANT
NDL SAFETY ECLIPSE 18X1.5 (NEEDLE) ×1 IMPLANT
NEEDLE 22X1 1/2 (OR ONLY) (NEEDLE) ×6 IMPLANT
NEEDLE HYPO 18GX1.5 SHARP (NEEDLE) ×3
NS IRRIG 1000ML POUR BTL (IV SOLUTION) ×6 IMPLANT
PACK TOTAL JOINT (CUSTOM PROCEDURE TRAY) ×3 IMPLANT
PAD ARMBOARD 7.5X6 YLW CONV (MISCELLANEOUS) ×6 IMPLANT
PAD CAST 4YDX4 CTTN HI CHSV (CAST SUPPLIES) ×1 IMPLANT
PADDING CAST COTTON 4X4 STRL (CAST SUPPLIES) ×3
PADDING CAST COTTON 6X4 STRL (CAST SUPPLIES) ×3 IMPLANT
PATELLA ASYMMETRIC 38X11 KNEE (Orthopedic Implant) ×2 IMPLANT
SET HNDPC FAN SPRY TIP SCT (DISPOSABLE) ×1 IMPLANT
STRIP CLOSURE SKIN 1/2X4 (GAUZE/BANDAGES/DRESSINGS) ×4 IMPLANT
SUCTION FRAZIER HANDLE 10FR (MISCELLANEOUS) ×2
SUCTION TUBE FRAZIER 10FR DISP (MISCELLANEOUS) ×1 IMPLANT
SUT MNCRL AB 3-0 PS2 18 (SUTURE) ×3 IMPLANT
SUT VIC AB 0 CT1 27 (SUTURE) ×9
SUT VIC AB 0 CT1 27XBRD ANBCTR (SUTURE) ×3 IMPLANT
SUT VIC AB 1 CT1 27 (SUTURE) ×15
SUT VIC AB 1 CT1 27XBRD ANBCTR (SUTURE) ×5 IMPLANT
SUT VIC AB 2-0 CT1 27 (SUTURE) ×12
SUT VIC AB 2-0 CT1 TAPERPNT 27 (SUTURE) ×4 IMPLANT
SYR 30ML LL (SYRINGE) ×9 IMPLANT
SYR TB 1ML LUER SLIP (SYRINGE) ×3 IMPLANT
TOWEL OR 17X24 6PK STRL BLUE (TOWEL DISPOSABLE) ×6 IMPLANT
TOWEL OR 17X26 10 PK STRL BLUE (TOWEL DISPOSABLE) ×6 IMPLANT
TRAY CATH 16FR W/PLASTIC CATH (SET/KITS/TRAYS/PACK) IMPLANT
TRI CRUC RET FEM SZ5 KNEE (Orthopedic Implant) ×3 IMPLANT
WATER STERILE IRR 1000ML POUR (IV SOLUTION) IMPLANT
YANKAUER SUCT BULB TIP NO VENT (SUCTIONS) ×2 IMPLANT

## 2018-04-01 NOTE — Transfer of Care (Signed)
Immediate Anesthesia Transfer of Care Note  Patient: Christopher Ross  Procedure(s) Performed: RIGHT TOTAL KNEE ARTHROPLASTY (Right )  Patient Location: PACU  Anesthesia Type:MAC, spinal  Level of Consciousness: awake, alert  and oriented  Airway & Oxygen Therapy: Patient Spontanous Breathing and Patient connected to nasal cannula oxygen  Post-op Assessment: Report given to RN and Post -op Vital signs reviewed and stable  Post vital signs: Reviewed and stable  Last Vitals:  Vitals Value Taken Time  BP 90/56 04/01/2018 11:00 AM  Temp    Pulse 66 04/01/2018 11:03 AM  Resp 14 04/01/2018 11:03 AM  SpO2 97 % 04/01/2018 11:03 AM  Vitals shown include unvalidated device data.  Last Pain:  Vitals:   04/01/18 7915  TempSrc:   PainSc: 0-No pain         Complications: No apparent anesthesia complications

## 2018-04-01 NOTE — Evaluation (Signed)
Physical Therapy Evaluation Patient Details Name: Christopher Ross MRN: [id number] DOB: 01/27/1956 Today's Date: 04/01/2018   History of Present Illness  Pt presents for R TKA, no pertinent PMH  Clinical Impression  Pt is s/p TKA resulting in the deficits listed below (see PT Problem List). Pt very anxious about mobility but did well, ambulated 30' with RW and min A. Expect pt to progress well.  Pt will benefit from skilled PT to increase their independence and safety with mobility to allow discharge to the venue listed below.      Follow Up Recommendations Follow surgeon's recommendation for DC plan and follow-up therapies    Equipment Recommendations  None recommended by PT    Recommendations for Other Services       Precautions / Restrictions Precautions Precautions: Knee Precaution Booklet Issued: Yes (comment) Precaution Comments: ther ex given and proper positioning and use of sero knee foam discussed Required Braces or Orthoses: Knee Immobilizer - Right Knee Immobilizer - Right: Other (comment)(until discontinued) Restrictions Weight Bearing Restrictions: Yes RLE Weight Bearing: Weight bearing as tolerated      Mobility  Bed Mobility Overal bed mobility: Needs Assistance Bed Mobility: Supine to Sit     Supine to sit: Min assist     General bed mobility comments: vc's for sequencing, min A to RLE to get to EOB  Transfers Overall transfer level: Needs assistance Equipment used: Rolling walker (2 wheeled) Transfers: Sit to/from Stand Sit to Stand: Min assist         General transfer comment: vc's for hand placement, min A for safety  Ambulation/Gait Ambulation/Gait assistance: Min assist Gait Distance (Feet): 30 Feet Assistive device: Rolling walker (2 wheeled) Gait Pattern/deviations: Step-to pattern;Decreased weight shift to left;Decreased step length - right;Decreased stance time - right Gait velocity: decreased Gait velocity interpretation: <1.31  ft/sec, indicative of household ambulator General Gait Details: pt ambulated with KI for safety though no instability felt in standing. Pt encouraged to put some wt on RLE as he was very hesitant to do this  Careers information officer    Modified Rankin (Stroke Patients Only)       Balance Overall balance assessment: No apparent balance deficits (not formally assessed)                                           Pertinent Vitals/Pain Pain Assessment: Faces Faces Pain Scale: Hurts a little bit Pain Location: R knee Pain Descriptors / Indicators: Operative site guarding;Sore Pain Intervention(s): Limited activity within patient's tolerance;Monitored during session    Home Living Family/patient expects to be discharged to:: Private residence Living Arrangements: Spouse/significant other Available Help at Discharge: Family;Available PRN/intermittently Type of Home: House Home Access: Stairs to enter   Entrance Stairs-Number of Steps: 1 Home Layout: Two level;Able to live on main level with bedroom/bathroom;Full bath on main level Home Equipment: Walker - 2 wheels;Bedside commode      Prior Function Level of Independence: Independent         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 Extremity Assessment Upper Extremity Assessment: Overall WFL for tasks assessed    Lower Extremity Assessment Lower Extremity Assessment: RLE deficits/detail RLE Deficits / Details: hip flex 3-/5, knee ext 3-/5 RLE: Unable  to fully assess due to pain RLE Sensation: decreased light touch;decreased proprioception RLE Coordination: decreased gross motor    Cervical / Trunk Assessment Cervical / Trunk Assessment: Normal  Communication   Communication: No difficulties  Cognition Arousal/Alertness: Lethargic;Suspect due to medications Behavior During Therapy: Anxious Overall Cognitive Status: Within  Functional Limits for tasks assessed                                        General Comments      Exercises Total Joint Exercises Ankle Circles/Pumps: AROM;Both;10 reps;Supine Quad Sets: AROM;Both;10 reps;Supine Heel Slides: AROM;Right;10 reps;Supine Hip ABduction/ADduction: AAROM;Right;10 reps;Supine Goniometric ROM: 20-60   Assessment/Plan    PT Assessment Patient needs continued PT services  PT Problem List Decreased strength;Decreased range of motion;Decreased activity tolerance;Decreased mobility;Decreased knowledge of use of DME;Decreased knowledge of precautions;Pain       PT Treatment Interventions DME instruction;Gait training;Stair training;Functional mobility training;Therapeutic activities;Therapeutic exercise;Neuromuscular re-education;Patient/family education    PT Goals (Current goals can be found in the Care Plan section)  Acute Rehab PT Goals Patient Stated Goal: return to home and work PT Goal Formulation: With patient Time For Goal Achievement: 04/08/18 Potential to Achieve Goals: Good    Frequency BID   Barriers to discharge        Co-evaluation               AM-PAC PT "6 Clicks" Daily Activity  Outcome Measure Difficulty turning over in bed (including adjusting bedclothes, sheets and blankets)?: A Little Difficulty moving from lying on back to sitting on the side of the bed? : A Little Difficulty sitting down on and standing up from a chair with arms (e.g., wheelchair, bedside commode, etc,.)?: A Little Help needed moving to and from a bed to chair (including a wheelchair)?: A Little Help needed walking in hospital room?: A Little Help needed climbing 3-5 steps with a railing? : A Lot 6 Click Score: 17    End of Session Equipment Utilized During Treatment: Gait belt;Right knee immobilizer Activity Tolerance: Patient tolerated treatment well Patient left: in chair;with call bell/phone within reach;with family/visitor  present(in zero knee foam) Nurse Communication: Mobility status PT Visit Diagnosis: Other abnormalities of gait and mobility (R26.89);Pain Pain - Right/Left: Right Pain - part of body: Knee    Time: [id number] PT Time Calculation (min) (ACUTE ONLY): 43 min   Charges:   PT Evaluation $PT Eval Low Complexity: 1 Low PT Treatments $Gait Training: 8-22 mins $Therapeutic Exercise: 8-22 mins   PT G Codes:        Christopher Ross, PT  Acute Rehab Services  [phone]   Christopher Ross 04/01/2018, 4:39 PM

## 2018-04-01 NOTE — Anesthesia Preprocedure Evaluation (Signed)
Anesthesia Evaluation  Patient identified by MRN, date of birth, ID band Patient awake    Reviewed: Allergy & Precautions, NPO status , Patient's Chart, lab work & pertinent test results  Airway Mallampati: II  TM Distance: >3 FB Neck ROM: Full    Dental no notable dental hx.    Pulmonary neg pulmonary ROS,    Pulmonary exam normal breath sounds clear to auscultation       Cardiovascular negative cardio ROS Normal cardiovascular exam Rhythm:Regular Rate:Normal     Neuro/Psych negative neurological ROS  negative psych ROS   GI/Hepatic negative GI ROS, Neg liver ROS,   Endo/Other  negative endocrine ROS  Renal/GU negative Renal ROS  negative genitourinary   Musculoskeletal negative musculoskeletal ROS (+)   Abdominal   Peds negative pediatric ROS (+)  Hematology negative hematology ROS (+)   Anesthesia Other Findings   Reproductive/Obstetrics negative OB ROS                             Anesthesia Physical Anesthesia Plan  ASA: II  Anesthesia Plan: Spinal   Post-op Pain Management:  Regional for Post-op pain   Induction: Intravenous  PONV Risk Score and Plan: 1 and Ondansetron and Treatment may vary due to age or medical condition  Airway Management Planned: Simple Face Mask  Additional Equipment:   Intra-op Plan:   Post-operative Plan:   Informed Consent: I have reviewed the patients History and Physical, chart, labs and discussed the procedure including the risks, benefits and alternatives for the proposed anesthesia with the patient or authorized representative who has indicated his/her understanding and acceptance.   Dental advisory given  Plan Discussed with: CRNA  Anesthesia Plan Comments:         Anesthesia Quick Evaluation

## 2018-04-01 NOTE — Anesthesia Procedure Notes (Signed)
Anesthesia Regional Block: Adductor canal block   Pre-Anesthetic Checklist: ,, timeout performed, Correct Patient, Correct Site, Correct Laterality, Correct Procedure, Correct Position, site marked, Risks and benefits discussed,  Surgical consent,  Pre-op evaluation,  At surgeon's request and post-op pain management  Laterality: Right and Lower  Prep: Maximum Sterile Barrier Precautions used, chloraprep       Needles:  Injection technique: Single-shot  Needle Type: Echogenic Stimulator Needle     Needle Length: 10cm      Additional Needles:   Procedures:,,,, ultrasound used (permanent image in chart),,,,  Narrative:  Start time: 04/01/2018 6:55 AM End time: 04/01/2018 7:05 AM Injection made incrementally with aspirations every 5 mL.  Performed by: Personally  Anesthesiologist: Phillips Groutarignan, Araya Roel, MD  Additional Notes: Risks, benefits and alternative to block explained extensively.  Patient tolerated procedure well, without complications.

## 2018-04-01 NOTE — Progress Notes (Signed)
Pt does have some varus alignment right leg

## 2018-04-01 NOTE — Anesthesia Postprocedure Evaluation (Signed)
Anesthesia Post Note  Patient: Christopher Ross  Procedure(s) Performed: RIGHT TOTAL KNEE ARTHROPLASTY (Right )     Patient location during evaluation: PACU Anesthesia Type: Spinal Level of consciousness: awake and alert Pain management: pain level controlled Vital Signs Assessment: post-procedure vital signs reviewed and stable Respiratory status: spontaneous breathing and respiratory function stable Cardiovascular status: blood pressure returned to baseline and stable Postop Assessment: no headache, no backache, spinal receding and no apparent nausea or vomiting Anesthetic complications: no    Last Vitals:  Vitals:   04/01/18 1215 04/01/18 1237  BP: 116/73 102/69  Pulse: (!) 56 (!) 54  Resp: 14 16  Temp:  36.6 C  SpO2: 96% 98%    Last Pain:  Vitals:   04/01/18 1237  TempSrc: Oral  PainSc: 4                  Phillips Groutarignan, Yesha Muchow

## 2018-04-01 NOTE — Interval H&P Note (Signed)
History and Physical Interval Note:  04/01/2018 7:39 AM  Christopher Ross  has presented today for surgery, with the diagnosis of RIGHT KNEE OSTEOARTHRITIS  The various methods of treatment have been discussed with the patient and family. After consideration of risks, benefits and other options for treatment, the patient has consented to  Procedure(s): RIGHT TOTAL KNEE ARTHROPLASTY (Right) as a surgical intervention .  The patient's history has been reviewed, patient examined, no change in status, stable for surgery.  I have reviewed the patient's chart and labs.  Questions were answered to the patient's satisfaction.     Burnard BuntingG Scott Teressa Mcglocklin

## 2018-04-01 NOTE — Brief Op Note (Signed)
04/01/2018  10:54 AM  PATIENT:  Algernon HuxleyMichael K Talbot  62 y.o. male  PRE-OPERATIVE DIAGNOSIS:  RIGHT KNEE OSTEOARTHRITIS  POST-OPERATIVE DIAGNOSIS:  RIGHT KNEE OSTEOARTHRITIS  PROCEDURE:  Procedure(s): RIGHT TOTAL KNEE ARTHROPLASTY  SURGEON:  Surgeon(s): August Saucerean, Corrie MckusickGregory Scott, MD  ASSISTANT: Patrick Jupiterarla Bethune rnfa  ANESTHESIA:   spinal  EBL: 75 ml    Total I/O In: 1000 [I.V.:1000] Out: 300 [Urine:250; Blood:50]  BLOOD ADMINISTERED: none  DRAINS: none   LOCAL MEDICATIONS USED: Marcaine morphine clonidine Exparel  SPECIMEN:  No Specimen  COUNTS:  YES  TOURNIQUET: 91 minutes at 300 mmHg  DICTATION: .[id number] PLAN OF CARE: Admit to inpatient   PATIENT DISPOSITION:  PACU - hemodynamically stable

## 2018-04-01 NOTE — Op Note (Signed)
NAME: Christopher Ross, Christopher K. MEDICAL RECORD [id number] ACCOUNT [id number] DATE OF BIRTH:[birth year] FACILITY: [facility] LOCATION: [facility] PHYSICIAN:Mellany Dinsmore Diamantina ProvidenceS. Taavi Hoose, MD  OPERATIVE REPORT  DATE OF PROCEDURE:  04/01/2018  PREOPERATIVE DIAGNOSIS:  Right knee arthritis.  POSTOPERATIVE DIAGNOSIS:  Right knee arthritis.  PROCEDURE:  Right total knee replacement using Stryker press-fit components, size 5 femur, size 6 tibia, 9 mm deep dish PCL retaining insert, 38 mm 3 peg press fit patella components, cruciate retaining.  SURGEON:  Cammy CopaGregory Scott Grazia Taffe, MD  ASSISTANT:  Patrick Jupiterarla Bethune, RNFA.  INDICATIONS:  This is a patient with end-stage right knee arthritis who presents for operative management after explanation of risks and benefits and failure of conservative management.  DESCRIPTION OF PROCEDURE:  The patient was brought to the operating room where spinal anesthetic was induced.  Preoperative antibiotics administered.  Timeout was called.  Right leg prescrubbed with alcohol and Betadine, allowed to air dry prepped with  DuraPrep solution and draped in a sterile manner.  Ioban used to cover the operative field.  Leg was elevated and exsanguinated with the Esmarch wrap.  Tourniquet was inflated.  Medial parapatellar approach was made to the knee.  Skin and subcutaneous  tissue were sharply divided.  Median parapatellar approach was made and marked with a #1 Vicryl suture.  Patella was everted, fat pad was partially excised.  Lateral patellofemoral ligament was released.  Soft tissue from the anterior distal femur was  removed.  Soft tissue dissection was performed on the medial aspect of the tibia due to his preoperative varus deformity.  Following this approach and dissection, intramedullary alignment was used to cut the tibia perpendicular to the mechanical axis of  the tibia itself.  A 2 mm cut was made off the most affected medial tibial plateau.  Collaterals and posterior cruciate ligament were  protected.  After making this cut, attention was directed towards the femur.  An 8 mm cut was made on the distal femur.   Because of his preoperative flexion contracture, this was made into a 10 mm distal femoral cut.  This allowed a 9 mm spacer to be placed with the knee achieving full extension and the alignment improved from preop varus to neutral.  Following cuts on  the distal femur and tibia, the tibia was keel punched.  The PCL was maintained.  The patella was then cut down from a size 24 down to 12.  An 11 mm thick 3 peg patellar trial was placed.  With trial components in position, the patient achieved full  extension and had very good flexion.  Collaterals were stable at 0, 30 and 90 degrees.  Trial components were removed.  Thorough irrigation performed.  Exparel and Marcaine saline solution then injected into the capsule.  Tranexamic acid sponge topically  placed to minimize postoperative bleeding.  Components were then tapped into position with excellent press-fit obtained.  The patient's bone quality was excellent.  Same stability parameters were maintained.  Tourniquet released at this time.  Bleeding  points encountered were controlled using electrocautery.  Tranexamic acid sponge again allowed to sit within the knee and then was removed.  Irrigation performed and the knee was then closed over a bolster using #1 Vicryl suture to reapproximate the  medial parapatellar approach.  Next layers were closed using 0 Vicryl suture, 2-0 Vicryl suture and a 3-0 Monocryl.  Steri-Strips were applied.  The lateral aspect of the incision was anesthetized with solution of Marcaine, morphine and clonidine.   Aquacel dressing placed and an  Ace wrap placed.  The patient tolerated the procedure well, without immediate complications, transferred to recovery room in stable condition.  TN/NUANCE  D:04/01/2018 T:04/01/2018 JOB:[id number]

## 2018-04-02 ENCOUNTER — Encounter (HOSPITAL_COMMUNITY): Payer: Self-pay | Admitting: Orthopedic Surgery

## 2018-04-02 NOTE — Progress Notes (Signed)
Physical Therapy Treatment Patient Details Name: Christopher Ross MRN: [id number] DOB: 07/25/1956 Today's Date: 04/02/2018    History of Present Illness Pt presents for R TKA, no pertinent PMH    PT Comments    Pt making fair progress with functional mobility but remains limited secondary to R knee pain. Pt also very anxious with mobility throughout. He tolerated short distance ambulation in room, but very hesitant to allow any weight bearing through R LE. Pt would continue to benefit from skilled physical therapy services at this time while admitted and after d/c to address the below listed limitations in order to improve overall safety and independence with functional mobility.    Follow Up Recommendations  Home health PT;Supervision/Assistance - 24 hour     Equipment Recommendations  None recommended by PT    Recommendations for Other Services       Precautions / Restrictions Precautions Precautions: Knee Precaution Booklet Issued: Yes (comment) Precaution Comments: reviewed positioning precautions of LE following knee surger Restrictions Weight Bearing Restrictions: Yes RLE Weight Bearing: Weight bearing as tolerated    Mobility  Bed Mobility Overal bed mobility: Needs Assistance Bed Mobility: Supine to Sit     Supine to sit: Min assist     General bed mobility comments: increased time and effort, cueing for sequencing, assistance with R LE movement off of bed  Transfers Overall transfer level: Needs assistance Equipment used: Rolling walker (2 wheeled) Transfers: Sit to/from Stand Sit to Stand: Min guard         General transfer comment: increased time and effort, good technique, min guard for safety  Ambulation/Gait Ambulation/Gait assistance: Min guard Gait Distance (Feet): 20 Feet Assistive device: Rolling walker (2 wheeled) Gait Pattern/deviations: Step-to pattern;Decreased step length - right;Decreased step length - left;Decreased stride  length;Decreased stance time - right;Decreased weight shift to right;Antalgic Gait velocity: decreased Gait velocity interpretation: <1.31 ft/sec, indicative of household ambulator General Gait Details: pt with mild instability but no overt LOB or need for physical assistance, min guard for safety; pt very anxious in general with ambulation and about weight bearing on R LE   Stairs             Wheelchair Mobility    Modified Rankin (Stroke Patients Only)       Balance Overall balance assessment: Needs assistance Sitting-balance support: Feet supported Sitting balance-Leahy Scale: Fair     Standing balance support: During functional activity;Bilateral upper extremity supported Standing balance-Leahy Scale: Poor Standing balance comment: pt reliant on bilateral UEs on RW                            Cognition Arousal/Alertness: Awake/alert Behavior During Therapy: Anxious Overall Cognitive Status: Impaired/Different from baseline Area of Impairment: Safety/judgement;Problem solving                         Safety/Judgement: Decreased awareness of safety   Problem Solving: Difficulty sequencing;Requires verbal cues        Exercises Total Joint Exercises Quad Sets: AROM;Strengthening;Right;10 reps;Seated Heel Slides: AAROM;Right;10 reps;Seated Hip ABduction/ADduction: AAROM;Right;10 reps;Seated    General Comments        Pertinent Vitals/Pain Pain Assessment: 0-10 Pain Score: 5  Pain Location: R knee Pain Descriptors / Indicators: Operative site guarding;Sore Pain Intervention(s): Monitored during session;Repositioned;Patient requesting pain meds-RN notified    Home Living  Prior Function            PT Goals (current goals can now be found in the care plan section) Acute Rehab PT Goals PT Goal Formulation: With patient Time For Goal Achievement: 04/08/18 Potential to Achieve Goals: Good Progress  towards PT goals: Progressing toward goals    Frequency    7X/week      PT Plan Current plan remains appropriate    Co-evaluation              AM-PAC PT "6 Clicks" Daily Activity  Outcome Measure  Difficulty turning over in bed (including adjusting bedclothes, sheets and blankets)?: Unable Difficulty moving from lying on back to sitting on the side of the bed? : Unable Difficulty sitting down on and standing up from a chair with arms (e.g., wheelchair, bedside commode, etc,.)?: Unable Help needed moving to and from a bed to chair (including a wheelchair)?: A Little Help needed walking in hospital room?: A Little Help needed climbing 3-5 steps with a railing? : A Little 6 Click Score: 12    End of Session Equipment Utilized During Treatment: Gait belt Activity Tolerance: Patient limited by pain Patient left: in chair;with call bell/phone within reach;Other (comment)(Bone Foam in place on R LE) Nurse Communication: Mobility status PT Visit Diagnosis: Other abnormalities of gait and mobility (R26.89);Pain Pain - Right/Left: Right Pain - part of body: Knee     Time: 8295-6213 PT Time Calculation (min) (ACUTE ONLY): 29 min  Charges:  $Gait Training: 8-22 mins $Therapeutic Activity: 8-22 mins                    G Codes:       [place], [place], [place] [phone]    Alessandra Bevels [place] 04/02/2018, 10:10 AM

## 2018-04-02 NOTE — Progress Notes (Signed)
Physical Therapy Treatment Patient Details Name: Christopher Ross MRN: [id number] DOB: 1956/03/18 Today's Date: 04/02/2018    History of Present Illness Pt presents for R TKA, no pertinent PMH    PT Comments    Pt making slow progress with functional mobility and remains very limited secondary to pain and anxiety regarding mobility. Pt with very minimal ROM at knee, very painful and anxious when therapist attempted to assist him with ROM. Pt would continue to benefit from skilled physical therapy services at this time while admitted and after d/c to address the below listed limitations in order to improve overall safety and independence with functional mobility.    Follow Up Recommendations  Home health PT;Supervision/Assistance - 24 hour     Equipment Recommendations  None recommended by PT    Recommendations for Other Services       Precautions / Restrictions Precautions Precautions: Knee Precaution Booklet Issued: Yes (comment) Precaution Comments: reviewed positioning precautions of LE following knee surger Restrictions Weight Bearing Restrictions: Yes RLE Weight Bearing: Weight bearing as tolerated    Mobility  Bed Mobility Overal bed mobility: Needs Assistance Bed Mobility: Supine to Sit     Supine to sit: Min assist     General bed mobility comments: pt OOB in recliner chair upon arrival  Transfers Overall transfer level: Needs assistance Equipment used: Rolling walker (2 wheeled) Transfers: Sit to/from Stand Sit to Stand: Min guard         General transfer comment: increased time and effort, good technique, min guard for safety  Ambulation/Gait Ambulation/Gait assistance: Min guard Gait Distance (Feet): 50 Feet Assistive device: Rolling walker (2 wheeled) Gait Pattern/deviations: Step-to pattern;Decreased step length - right;Decreased step length - left;Decreased stride length;Decreased stance time - right;Decreased weight shift to  right;Antalgic Gait velocity: decreased Gait velocity interpretation: <1.31 ft/sec, indicative of household ambulator General Gait Details: pt with a very slow, cautious and guarded gait; cueing for sequencing with RW; pt very over-analytical of ambulation and constantly seeking feedback   Stairs             Wheelchair Mobility    Modified Rankin (Stroke Patients Only)       Balance Overall balance assessment: Needs assistance Sitting-balance support: Feet supported Sitting balance-Leahy Scale: Fair     Standing balance support: During functional activity;Bilateral upper extremity supported Standing balance-Leahy Scale: Poor Standing balance comment: pt reliant on bilateral UEs on RW                            Cognition Arousal/Alertness: Awake/alert Behavior During Therapy: Anxious Overall Cognitive Status: Impaired/Different from baseline Area of Impairment: Safety/judgement;Problem solving                         Safety/Judgement: Decreased awareness of safety   Problem Solving: Difficulty sequencing;Requires verbal cues        Exercises Total Joint Exercises Quad Sets: AROM;Strengthening;Right;10 reps;Seated Gluteal Sets: AROM;Strengthening;Both;10 reps;Standing Heel Slides: AAROM;Right;10 reps;Seated Hip ABduction/ADduction: AAROM;Right;10 reps;Seated Goniometric ROM: Flexion = 31 degrees; Extension = lacking 15 degrees to neutral; measured in sitting Marching in Standing: AROM;Strengthening;Right;20 reps;Standing General Exercises - Lower Extremity Mini-Sqauts: AROM;Strengthening;Both;10 reps    General Comments        Pertinent Vitals/Pain Pain Assessment: Faces Pain Score: 5  Faces Pain Scale: Hurts little more Pain Location: R knee Pain Descriptors / Indicators: Operative site guarding;Sore Pain Intervention(s): Monitored during session;Repositioned    Home  Living                      Prior Function             PT Goals (current goals can now be found in the care plan section) Acute Rehab PT Goals PT Goal Formulation: With patient Time For Goal Achievement: 04/08/18 Potential to Achieve Goals: Good Progress towards PT goals: Progressing toward goals    Frequency    7X/week      PT Plan Current plan remains appropriate    Co-evaluation              AM-PAC PT "6 Clicks" Daily Activity  Outcome Measure  Difficulty turning over in bed (including adjusting bedclothes, sheets and blankets)?: Unable Difficulty moving from lying on back to sitting on the side of the bed? : Unable Difficulty sitting down on and standing up from a chair with arms (e.g., wheelchair, bedside commode, etc,.)?: Unable Help needed moving to and from a bed to chair (including a wheelchair)?: A Little Help needed walking in hospital room?: A Little Help needed climbing 3-5 steps with a railing? : A Little 6 Click Score: 12    End of Session Equipment Utilized During Treatment: Gait belt Activity Tolerance: Patient limited by pain Patient left: in chair;with call bell/phone within reach Nurse Communication: Mobility status PT Visit Diagnosis: Other abnormalities of gait and mobility (R26.89);Pain Pain - Right/Left: Right Pain - part of body: Knee     Time: [id number] PT Time Calculation (min) (ACUTE ONLY): 34 min  Charges:  $Gait Training: 8-22 mins $Therapeutic Exercise: 8-22 mins                    G Codes:       ChiltonJennifer Diamone [place], [place]PT, [place]PT [phone]    Alessandra BevelsJennifer M Jhada Risk 04/02/2018, 1:41 PM

## 2018-04-02 NOTE — Care Management Note (Signed)
  Case Management Note  Patient Details  Name: Christopher Ross MRN: [id number] Date of Birth: 11/07/1955  Subjective/Objective:  62 yr old gentleman s/p right total knee arthroplasty.                  Action/Plan: Case manager spoke with patient concerning discharge plan and DME. Patient was preoperatively setup with [facility], no changes. DME has been delivered. Patient expresses concerns about being able to mobilize within next few days. CM attempted to reassure himethat as he works with therapy and does his exercises he will do just fine. He is concerned about pain as well. Currently receiving IV dilaudid.    Expected Discharge Date:    04/03/18              Expected Discharge Plan:  Home w Home Health Services  In-House Referral:  NA  Discharge planning Services  CM Consult  Post Acute Care Choice:  Home Health, Durable Medical Equipment Choice offered to:  Patient  DME Arranged:  3-N-1, CPM, Walker rolling DME Agency:  [employer]  HH Arranged:  PT HH [facility]  [facility]  Status of Service:  Completed, signed off  If discussed at [employer] Meetings, dates discussed:    Additional Comments:  Durenda GuthrieBrady, Shylo Zamor Naomi, RN 04/02/2018, 12:11 PM

## 2018-04-02 NOTE — Progress Notes (Signed)
Subjective: Patient stable.  Pain controlled    Objective: Vital signs in last 24 hours: Temp:  [97.5 F (36.4 C)-98.7 F (37.1 C)] 98.6 F (37 C) (07/24 0404) Pulse Rate:  [54-79] 71 (07/24 0404) Resp:  [14-19] 15 (07/24 0404) BP: (89-123)/(56-73) 102/70 (07/24 0404) SpO2:  [93 %-100 %] 95 % (07/24 0404)  Intake/Output from previous day: 07/23 0701 - 07/24 0700 In: 1790 [P.O.:240; I.V.:1500; IV Piggyback:50] Out: 1200 [Urine:1150; Blood:50] Intake/Output this shift: No intake/output data recorded.  Exam:  Dorsiflexion/Plantar flexion intact  Labs: No results for input(s): HGB in the last 72 hours. No results for input(s): WBC, RBC, HCT, PLT in the last 72 hours. No results for input(s): NA, K, CL, CO2, BUN, CREATININE, GLUCOSE, CALCIUM in the last 72 hours. No results for input(s): LABPT, INR in the last 72 hours.  Assessment/Plan: Plan today is for mobilization with physical therapy.  Anticipate discharge to home tomorrow   Burnard BuntingG Scott Dean 04/02/2018, 8:15 AM

## 2018-04-03 ENCOUNTER — Inpatient Hospital Stay (HOSPITAL_COMMUNITY): Payer: BC Managed Care – PPO

## 2018-04-03 NOTE — Progress Notes (Signed)
Subjective: cpm 85 but pain with weight bearing   Objective: Vital signs in last 24 hours: Temp:  [98.3 F (36.8 C)-98.8 F (37.1 C)] 98.6 F (37 C) (07/25 0416) Pulse Rate:  [75-88] 88 (07/25 0416) Resp:  [16] 16 (07/25 0416) BP: (122-136)/(70-76) 122/70 (07/25 0416) SpO2:  [92 %-100 %] 92 % (07/25 0416)  Intake/Output from previous day: 07/24 0701 - 07/25 0700 In: 480 [P.O.:480] Out: 525 [Urine:525] Intake/Output this shift: Total I/O In: 480 [P.O.:480] Out: 400 [Urine:400]  Exam:  No cellulitis present Compartment soft  Labs: No results for input(s): HGB in the last 72 hours. No results for input(s): WBC, RBC, HCT, PLT in the last 72 hours. No results for input(s): NA, K, CL, CO2, BUN, CREATININE, GLUCOSE, CALCIUM in the last 72 hours. No results for input(s): LABPT, INR in the last 72 hours.  Assessment/Plan: Plan xray and pt today - dc likeky am   Christopher Ross 04/03/2018, 10:55 AM

## 2018-04-03 NOTE — Progress Notes (Signed)
Physical Therapy Treatment Patient Details Name: Christopher Ross MRN: [id number] DOB: [birth year] Today's Date: 04/03/2018    History of Present Illness Pt presents for R TKA, no pertinent PMH    PT Comments    Pt performed gait training and functional mobility with limited knee flexion.  Pt extremely anxious and required increased time.  Plan for progression of therapeutic exercises and stair training this afternoon.     Follow Up Recommendations  Follow surgeon's recommendation for DC plan and follow-up therapies     Equipment Recommendations  None recommended by PT    Recommendations for Other Services       Precautions / Restrictions Precautions Precautions: Knee Precaution Booklet Issued: Yes (comment) Precaution Comments: reviewed positioning precautions of LE following knee surger Restrictions Weight Bearing Restrictions: Yes RLE Weight Bearing: Weight bearing as tolerated    Mobility  Bed Mobility Overal bed mobility: Needs Assistance Bed Mobility: Supine to Sit     Supine to sit: Min assist     General bed mobility comments: Cues for use of LLE to assist RLE.  Pt tolerated well.  Increased time and effort due to anxiety.    Transfers Overall transfer level: Needs assistance Equipment used: Rolling walker (2 wheeled) Transfers: Sit to/from Stand Sit to Stand: Min guard         General transfer comment: Cues for hand and foot placement to and from seated surface.    Ambulation/Gait Ambulation/Gait assistance: Min guard Gait Distance (Feet): 200 Feet Assistive device: Rolling walker (2 wheeled) Gait Pattern/deviations: Step-to pattern;Decreased step length - right;Decreased step length - left;Decreased stride length;Decreased stance time - right;Decreased weight shift to right;Antalgic;Trunk flexed Gait velocity: decreased   General Gait Details: Cues for gait symmetry, posture and weight bearing.  Pt with significant increase in gait training.      Stairs             Wheelchair Mobility    Modified Rankin (Stroke Patients Only)       Balance Overall balance assessment: Needs assistance Sitting-balance support: Feet supported Sitting balance-Leahy Scale: Fair       Standing balance-Leahy Scale: Poor Standing balance comment: pt reliant on bilateral UEs on RW                            Cognition Arousal/Alertness: Awake/alert Behavior During Therapy: Anxious Overall Cognitive Status: Impaired/Different from baseline Area of Impairment: Safety/judgement;Problem solving                         Safety/Judgement: Decreased awareness of safety   Problem Solving: Difficulty sequencing;Requires verbal cues General Comments: Pt is very particular in how he wants his R knee moved.        Exercises Total Joint Exercises Knee Flexion: AROM;AAROM;Right;10 reps;Seated(x10 arom, x10 aarom) Goniometric ROM: 15-45 degrees flexion.  R knee.      General Comments        Pertinent Vitals/Pain Pain Assessment: 0-10 Pain Score: 5  Pain Location: R knee Pain Descriptors / Indicators: Operative site guarding;Sore Pain Intervention(s): Monitored during session;Repositioned;Ice applied    Home Living                      Prior Function            PT Goals (current goals can now be found in the care plan section) Acute Rehab PT Goals Patient Stated Goal: return  to home and work Potential to Achieve Goals: Good Progress towards PT goals: Progressing toward goals    Frequency    7X/week      PT Plan Current plan remains appropriate    Co-evaluation              AM-PAC PT "6 Clicks" Daily Activity  Outcome Measure  Difficulty turning over in bed (including adjusting bedclothes, sheets and blankets)?: Unable Difficulty moving from lying on back to sitting on the side of the bed? : Unable Difficulty sitting down on and standing up from a chair with arms (e.g.,  wheelchair, bedside commode, etc,.)?: Unable Help needed moving to and from a bed to chair (including a wheelchair)?: A Little Help needed walking in hospital room?: A Little Help needed climbing 3-5 steps with a railing? : A Little 6 Click Score: 12    End of Session   Activity Tolerance: Patient limited by pain Patient left: in chair;with call bell/phone within reach Nurse Communication: Mobility status PT Visit Diagnosis: Other abnormalities of gait and mobility (R26.89);Pain Pain - Right/Left: Right Pain - part of body: Knee     Time: [id number] PT Time Calculation (min) (ACUTE ONLY): 36 min  Charges:  $Gait Training: 8-22 mins $Therapeutic Exercise: 8-22 mins                     Joycelyn RuaAimee Shea Swalley, PTA pager [phone]    Florestine AversAimee J Bueford Arp 04/03/2018, 11:44 AM

## 2018-04-03 NOTE — Plan of Care (Signed)

## 2018-04-03 NOTE — Progress Notes (Signed)
Physical Therapy Treatment Patient Details Name: Christopher HuxleyMichael K Ross MRN: [id number] DOB: 11/10/1955 Today's Date: 04/03/2018    History of Present Illness Pt presents for R TKA, no pertinent PMH    PT Comments    Pt performed gait training, stair training and further progression of therapeutic exercises. Pt on track to d/c tomorrow and is progressing well.     Follow Up Recommendations  Follow surgeon's recommendation for DC plan and follow-up therapies     Equipment Recommendations  None recommended by PT    Recommendations for Other Services       Precautions / Restrictions Precautions Precautions: Knee Precaution Booklet Issued: Yes (comment) Precaution Comments: reviewed positioning precautions of LE following knee surgery Restrictions Weight Bearing Restrictions: Yes RLE Weight Bearing: Weight bearing as tolerated    Mobility  Bed Mobility Overal bed mobility: Needs Assistance Bed Mobility: Supine to Sit;Sit to Supine     Supine to sit: Min assist     General bed mobility comments: Cues for use of LLE to assist RLE.  Pt tolerated well.  Increased time and effort due to anxiety.    Transfers Overall transfer level: Needs assistance Equipment used: Rolling walker (2 wheeled) Transfers: Sit to/from Stand Sit to Stand: Supervision         General transfer comment: Cues for hand and foot placement to and from seated surface.    Ambulation/Gait Ambulation/Gait assistance: Min guard Gait Distance (Feet): 250 Feet Assistive device: Rolling walker (2 wheeled) Gait Pattern/deviations: Step-to pattern;Decreased step length - right;Decreased step length - left;Decreased stride length;Decreased stance time - right;Decreased weight shift to right;Antalgic;Trunk flexed Gait velocity: decreased   General Gait Details: Cues for gait symmetry, posture and weight bearing.  Pt with significant increase in gait training.  Attempted progression to step through pattern but  patient reports it is too painful.    Stairs Stairs: Yes Stairs assistance: Min assist Stair Management: No rails;Backwards;With walker;Step to pattern Number of Stairs: 2 General stair comments: Cues for sequencing and RW placement.    Wheelchair Mobility    Modified Rankin (Stroke Patients Only)       Balance Overall balance assessment: Needs assistance Sitting-balance support: Feet supported Sitting balance-Leahy Scale: Fair       Standing balance-Leahy Scale: Poor                              Cognition Arousal/Alertness: Awake/alert Behavior During Therapy: Anxious Overall Cognitive Status: Impaired/Different from baseline Area of Impairment: Safety/judgement;Problem solving                         Safety/Judgement: Decreased awareness of safety   Problem Solving: Difficulty sequencing;Requires verbal cues General Comments: Pt is very particular in how he wants his R knee moved.        Exercises Total Joint Exercises Ankle Circles/Pumps: AROM;Both;10 reps;Supine Quad Sets: AROM;Strengthening;Right;10 reps;Seated Long Arc Quad: AROM;Right;10 reps;Supine Knee Flexion: AROM;AAROM;Right;10 reps;Seated(x10 arom and x10 aarom) Goniometric ROM: 10-75 degrees R knee    General Comments        Pertinent Vitals/Pain Pain Assessment: 0-10 Pain Score: 5  Pain Location: R knee Pain Descriptors / Indicators: Operative site guarding;Sore Pain Intervention(s): Monitored during session;Repositioned;Ice applied    Home Living                      Prior Function  PT Goals (current goals can now be found in the care plan section) Acute Rehab PT Goals Patient Stated Goal: return to home and work Potential to Achieve Goals: Good Progress towards PT goals: Progressing toward goals    Frequency    7X/week      PT Plan Current plan remains appropriate    Co-evaluation              AM-PAC PT "6 Clicks" Daily  Activity  Outcome Measure  Difficulty turning over in bed (including adjusting bedclothes, sheets and blankets)?: Unable Difficulty moving from lying on back to sitting on the side of the bed? : Unable Difficulty sitting down on and standing up from a chair with arms (e.g., wheelchair, bedside commode, etc,.)?: Unable Help needed moving to and from a bed to chair (including a wheelchair)?: A Little Help needed walking in hospital room?: A Little Help needed climbing 3-5 steps with a railing? : A Little 6 Click Score: 12    End of Session Equipment Utilized During Treatment: Gait belt Activity Tolerance: Patient limited by pain Patient left: in chair;with call bell/phone within reach Nurse Communication: Mobility status PT Visit Diagnosis: Other abnormalities of gait and mobility (R26.89);Pain Pain - Right/Left: Right Pain - part of body: Knee     Time: 1555-1630 PT Time Calculation (min) (ACUTE ONLY): 35 min  Charges:  $Gait Training: 8-22 mins $Therapeutic Exercise: 8-22 mins                     Joycelyn Rua, PTA pager [phone]    Florestine Avers 04/03/2018, 4:39 PM

## 2018-04-04 ENCOUNTER — Other Ambulatory Visit (INDEPENDENT_AMBULATORY_CARE_PROVIDER_SITE_OTHER): Payer: Self-pay

## 2018-04-04 MED ORDER — DOCUSATE SODIUM 100 MG PO CAPS: 100.0000 mg | ORAL_CAPSULE | Freq: Two times a day (BID) | ORAL | 0 refills | Status: DC

## 2018-04-04 MED ORDER — ASPIRIN 81 MG PO CHEW: 81.0000 mg | CHEWABLE_TABLET | Freq: Two times a day (BID) | ORAL | 0 refills | Status: DC

## 2018-04-04 MED ORDER — OXYCODONE HCL 5 MG PO TABS: 5.0000 mg | ORAL_TABLET | ORAL | 0 refills | Status: DC | PRN

## 2018-04-04 MED ORDER — ONDANSETRON HCL 4 MG PO TABS: 4.0000 mg | ORAL_TABLET | Freq: Three times a day (TID) | ORAL | 0 refills | Status: DC | PRN

## 2018-04-04 MED ORDER — METHOCARBAMOL 500 MG PO TABS: 500.0000 mg | ORAL_TABLET | Freq: Four times a day (QID) | ORAL | 0 refills | Status: DC | PRN

## 2018-04-04 NOTE — Progress Notes (Signed)
Physical Therapy Treatment Patient Details Name: Christopher Ross MRN: [id number] DOB: Mar 22, 1956 Today's Date: 04/04/2018    History of Present Illness Pt presents for R TKA, no pertinent PMH    PT Comments    Pt performed gait training and re-trial of stairs to ensure correct technique for entry into home.  Pt tolerated session well and eager to return home.     Follow Up Recommendations  Follow surgeon's recommendation for DC plan and follow-up therapies     Equipment Recommendations  None recommended by PT    Recommendations for Other Services       Precautions / Restrictions Precautions Precautions: Knee Precaution Booklet Issued: Yes (comment) Precaution Comments: reviewed positioning precautions of LE following knee surgery Restrictions Weight Bearing Restrictions: Yes RLE Weight Bearing: Weight bearing as tolerated    Mobility  Bed Mobility               General bed mobility comments: Pt standing in bathroom brushing teeth on arrival.    Transfers Overall transfer level: Modified independent Equipment used: Rolling walker (2 wheeled) Transfers: Sit to/from Stand Sit to Stand: Modified independent (Device/Increase time)         General transfer comment: No assistance needed.    Ambulation/Gait Ambulation/Gait assistance: Min guard Gait Distance (Feet): 200 Feet Assistive device: Rolling walker (2 wheeled) Gait Pattern/deviations: Step-to pattern;Decreased step length - right;Decreased step length - left;Decreased stride length;Decreased stance time - right;Decreased weight shift to right;Antalgic;Trunk flexed;Step-through pattern     General Gait Details: Cues for progression to step through pattern.  Pt tolerated session this am with decreased pain and he is able to bear increased weight through RLE.     Stairs Stairs: Yes Stairs assistance: Min assist Stair Management: No rails;Backwards;With walker;Step to pattern Number of Stairs:  2 General stair comments: Cues for sequencing and RW placement. Pt able to recall sequencing and RW placement after 2nd trial.     Wheelchair Mobility    Modified Rankin (Stroke Patients Only)       Balance Overall balance assessment: Needs assistance Sitting-balance support: Feet supported Sitting balance-Leahy Scale: Fair       Standing balance-Leahy Scale: Poor Standing balance comment: pt reliant on bilateral UEs on RW                            Cognition Arousal/Alertness: Awake/alert Behavior During Therapy: WFL for tasks assessed/performed Overall Cognitive Status: Within Functional Limits for tasks assessed                                        Exercises      General Comments        Pertinent Vitals/Pain Pain Assessment: 0-10 Pain Score: 5  Pain Location: R knee Pain Descriptors / Indicators: Operative site guarding;Sore Pain Intervention(s): Monitored during session;Repositioned    Home Living                      Prior Function            PT Goals (current goals can now be found in the care plan section) Acute Rehab PT Goals Patient Stated Goal: return to home and work Potential to Achieve Goals: Good Progress towards PT goals: Progressing toward goals    Frequency    7X/week      PT  Plan Current plan remains appropriate    Co-evaluation              AM-PAC PT "6 Clicks" Daily Activity  Outcome Measure  Difficulty turning over in bed (including adjusting bedclothes, sheets and blankets)?: Unable Difficulty moving from lying on back to sitting on the side of the bed? : Unable Difficulty sitting down on and standing up from a chair with arms (e.g., wheelchair, bedside commode, etc,.)?: Unable Help needed moving to and from a bed to chair (including a wheelchair)?: A Little Help needed walking in hospital room?: A Little Help needed climbing 3-5 steps with a railing? : A Little 6 Click Score:  12    End of Session Equipment Utilized During Treatment: Gait belt Activity Tolerance: Patient limited by pain Patient left: in chair;with call bell/phone within reach Nurse Communication: Mobility status PT Visit Diagnosis: Other abnormalities of gait and mobility (R26.89);Pain Pain - Right/Left: Right Pain - part of body: Knee     Time: 1216-1227 PT Time Calculation (min) (ACUTE ONLY): 11 min  Charges:  $Gait Training: 8-22 mins                     Christopher Ross, PTA pager [phone]    Christopher Ross 04/04/2018, 4:07 PM

## 2018-04-04 NOTE — Progress Notes (Signed)
Subjective: Pt stable - xray ok   Objective: Vital signs in last 24 hours: Temp:  [98.4 F (36.9 C)-99.7 F (37.6 C)] 98.4 F (36.9 C) (07/26 0353) Pulse Rate:  [79-85] 79 (07/26 0353) Resp:  [16] 16 (07/26 0353) BP: (114-124)/(65-80) 124/65 (07/26 0353) SpO2:  [95 %-99 %] 99 % (07/26 0353)  Intake/Output from previous day: 07/25 0701 - 07/26 0700 In: 1440 [P.O.:1440] Out: 540 [Urine:540] Intake/Output this shift: No intake/output data recorded.  Exam:  Dorsiflexion/Plantar flexion intact  Labs: No results for input(s): HGB in the last 72 hours. No results for input(s): WBC, RBC, HCT, PLT in the last 72 hours. No results for input(s): NA, K, CL, CO2, BUN, CREATININE, GLUCOSE, CALCIUM in the last 72 hours. No results for input(s): LABPT, INR in the last 72 hours.  Assessment/Plan: Plan dc today   G Scott August SaucerDean 04/04/2018, 7:45 AM

## 2018-04-04 NOTE — Progress Notes (Signed)
Pt was ambulating to the hall with walker. Right knee incision with Aquacel dressing dry and intact. Pain is controlled with oral narcotics.  Discharge instructions given to pt. Discharged to home accompanied by wife.

## 2018-04-07 ENCOUNTER — Telehealth (INDEPENDENT_AMBULATORY_CARE_PROVIDER_SITE_OTHER): Payer: Self-pay | Admitting: Orthopedic Surgery

## 2018-04-07 NOTE — Telephone Encounter (Signed)
IC verbal given.  

## 2018-04-07 NOTE — Telephone Encounter (Signed)
Joey Hayes-(PT) with [facility] called needing verbal orders for HHPT 3 wk 2   The number to contact Joey is [phone]

## 2018-04-07 NOTE — Discharge Summary (Signed)
Physician Discharge Summary  Patient ID: Christopher Ross MRN: [id number] DOB/AGE: 64/04/1956 64 y.o.  Admit date: 04/01/2018 Discharge date: 04/04/2018   Admission Diagnoses:  Active Problems:   Arthritis of knee   Unilateral primary osteoarthritis, right knee   Discharge Diagnoses:  Same  Surgeries: Procedure(s): RIGHT TOTAL KNEE ARTHROPLASTY on 04/01/2018   Consultants:   Discharged Condition: Stable  Hospital Course: Christopher Ross is an 62 y.o. male who was admitted 04/01/2018 with a chief complaint of right knee pain, and found to have a diagnosis of right knee arthritis.  They were brought to the operating room on 04/01/2018 and underwent the above named procedures.  Patient tolerated the procedure well.  Started with physical therapy and CPM machine on postop day #1.  Postop radiographs look good.  Patient was somewhat slow to mobilize but had pain well controlled by postop day #3 and is discharged home in good condition on pain medicine and aspirin for DVT prophylaxis.  Home health physical therapy and home CPM machine will be utilized to facilitate mobilization.  He will follow-up with me in approximately 10 days  Antibiotics given:  Anti-infectives (From admission, onward)   Start     Dose/Rate Route Frequency Ordered Stop   04/01/18 1400  ceFAZolin (ANCEF) IVPB 1 g/50 mL premix     1 g 100 mL/hr over 30 Minutes Intravenous Every 6 hours 04/01/18 1230 04/01/18 2210   04/01/18 0630  ceFAZolin (ANCEF) IVPB 2g/100 mL premix     2 g 200 mL/hr over 30 Minutes Intravenous On call to O.R. 04/01/18 [id number] 04/01/18 0745   04/01/18 0629  ceFAZolin (ANCEF) 2-4 GM/100ML-% IVPB    Note to Pharmacy:  Kathrene BongoSmith, Catherine   : cabinet override      04/01/18 0629 04/01/18 0745    .  Recent vital signs:  Vitals:   04/03/18 1931 04/04/18 0353  BP: 123/80 124/65  Pulse: 85 79  Resp: 16 16  Temp: 99.7 F (37.6 C) 98.4 F (36.9 C)  SpO2: 96% 99%    Recent laboratory studies:   Results for orders placed or performed during the hospital encounter of 03/20/18  Surgical pcr screen  Result Value Ref Range   MRSA, PCR NEGATIVE NEGATIVE   Staphylococcus aureus NEGATIVE NEGATIVE  Urine culture  Result Value Ref Range   Specimen Description URINE, CLEAN CATCH    Special Requests NONE    Culture      NO GROWTH Performed at [facility], [address] [place], [place] [address]    Report Status 03/21/2018 FINAL   Basic metabolic panel  Result Value Ref Range   Sodium 139 135 - 145 mmol/L   Potassium 3.9 3.5 - 5.1 mmol/L   Chloride 104 98 - 111 mmol/L   CO2 26 22 - 32 mmol/L   Glucose, Bld 90 70 - 99 mg/dL   BUN 21 8 - 23 mg/dL   Creatinine, Ser 8.651.13 0.61 - 1.24 mg/dL   Calcium 9.4 8.9 - 78.410.3 mg/dL   GFR calc non Af Amer >60 >60 mL/min   GFR calc Af Amer >60 >60 mL/min   Anion gap 9 5 - 15  CBC  Result Value Ref Range   WBC 10.0 4.0 - 10.5 K/uL   RBC 4.56 4.22 - 5.81 MIL/uL   Hemoglobin 13.9 13.0 - 17.0 g/dL   HCT 69.643.1 29.539.0 - 28.452.0 %   MCV 94.5 78.0 - 100.0 fL   MCH 30.5 26.0 - 34.0 pg  MCHC 32.3 30.0 - 36.0 g/dL   RDW 16.1 09.6 - 04.5 %   Platelets 263 150 - 400 K/uL  Urinalysis, Routine w reflex microscopic  Result Value Ref Range   Color, Urine YELLOW YELLOW   APPearance HAZY (A) CLEAR   Specific Gravity, Urine 1.020 1.005 - 1.030   pH 7.0 5.0 - 8.0   Glucose, UA NEGATIVE NEGATIVE mg/dL   Hgb urine dipstick MODERATE (A) NEGATIVE   Bilirubin Urine NEGATIVE NEGATIVE   Ketones, ur NEGATIVE NEGATIVE mg/dL   Protein, ur NEGATIVE NEGATIVE mg/dL   Nitrite NEGATIVE NEGATIVE   Leukocytes, UA MODERATE (A) NEGATIVE   RBC / HPF >50 (H) 0 - 5 RBC/hpf   WBC, UA 21-50 0 - 5 WBC/hpf   Bacteria, UA NONE SEEN NONE SEEN   Mucus PRESENT   Type and screen Order type and screen if day of surgery is less than 15 days from draw of preadmission visit or order morning of surgery if day of surgery is greater than 6 days from preadmission visit.  Result Value  Ref Range   ABO/RH(D) O POS    Antibody Screen NEG    Sample Expiration 04/03/2018    Extend sample reason      NO TRANSFUSIONS OR PREGNANCY IN THE PAST 3 MONTHS Performed at [facility], [address] [place], [place] [address]   ABO/Rh  Result Value Ref Range   ABO/RH(D)      O POS Performed at [facility], [address], [place], [place] [address]     Discharge Medications:   Allergies as of 04/04/2018   No Known Allergies     Medication List    STOP taking these medications   oxyCODONE-acetaminophen 5-325 MG tablet Commonly known as:  PERCOCET   senna-docusate 8.6-50 MG tablet Commonly known as:  Senokot-S     TAKE these medications   aspirin 81 MG chewable tablet Chew 1 tablet (81 mg total) by mouth 2 (two) times daily.   docusate sodium 100 MG capsule Commonly known as:  COLACE Take 1 capsule (100 mg total) by mouth 2 (two) times daily.   eszopiclone 3 MG Tabs Generic drug:  Eszopiclone Take 3 mg by mouth at bedtime. Take immediately before bedtime   methocarbamol 500 MG tablet Commonly known as:  ROBAXIN Take 1 tablet (500 mg total) by mouth every 6 (six) hours as needed for muscle spasms.   oxybutynin 5 MG tablet Commonly known as:  DITROPAN Take 1 tablet (5 mg total) by mouth every 8 (eight) hours as needed for bladder spasms. Or nausea.   oxyCODONE 5 MG immediate release tablet Commonly known as:  Oxy IR/ROXICODONE Take 1-2 tablets (5-10 mg total) by mouth every 4 (four) hours as needed for moderate pain (pain score 4-6).   simvastatin 10 MG tablet Commonly known as:  ZOCOR Take 10 mg by mouth daily at 6 PM.   tamsulosin 0.4 MG Caps capsule Commonly known as:  FLOMAX Take 0.4 mg by mouth daily.   URO-MP 118 MG Caps Take 118 mg by mouth 3 (three) times daily.     ASK your doctor about these medications   ondansetron 4 MG tablet Commonly known as:  ZOFRAN Take 1 tablet (4 mg total) by mouth every 8 (eight) hours as needed  for nausea or vomiting.       Diagnostic Studies: Dg Knee Right Port  Result Date: 04/03/2018 CLINICAL DATA:  Pain, 2 days total arthroplasty EXAM:  PORTABLE RIGHT KNEE - 1-2 VIEW COMPARISON:  None. FINDINGS: Frontal and lateral views were obtained. Patient is status post total knee replacement with prosthetic components well-seated. No acute fracture or dislocation. No erosive change. Foci of air within the joint is an expected postoperative finding. A small amount of calcification is noted lateral the distal femoral condyle, likely of postoperative etiology. IMPRESSION: Status post total knee replacement with prosthetic components well-seated. No acute fracture or dislocation. Postoperative air noted. Electronically Signed   By: Bretta Bang III M.D.   On: 04/03/2018 12:25    Disposition:   Discharge Instructions    Call MD / Call 911   Complete by:  As directed    If you experience chest pain or shortness of breath, CALL 911 and be transported to the hospital emergency room.  If you develope a fever above 101 F, pus (white drainage) or increased drainage or redness at the wound, or calf pain, call your surgeon's office.   Constipation Prevention   Complete by:  As directed    Drink plenty of fluids.  Prune juice may be helpful.  You may use a stool softener, such as Colace (over the counter) 100 mg twice a day.  Use MiraLax (over the counter) for constipation as needed.   Diet - low sodium heart healthy   Complete by:  As directed    Discharge instructions   Complete by:  As directed    Weight bearing as tolerated cpm 1 hour 3 times a day Ok to remove knee dressing on next Friday - rewrap with ace wrap then   Increase activity slowly as tolerated   Complete by:  As directed       Follow-up Information    [facility].   Specialty:  Home Health Services Why:  A representative from [facility] will contact you to arrange start date and time for your therapy.   Contact information: [address] [place] [address] [place] [address] [phone]            Signed: Burnard Bunting 04/07/2018, 11:42 AM

## 2018-04-08 ENCOUNTER — Telehealth (INDEPENDENT_AMBULATORY_CARE_PROVIDER_SITE_OTHER): Payer: Self-pay | Admitting: Orthopedic Surgery

## 2018-04-08 MED ORDER — OXYCODONE HCL 5 MG PO TABS: ORAL_TABLET | ORAL | 0 refills | Status: DC

## 2018-04-08 MED ORDER — ESZOPICLONE 3 MG PO TABS: 3.0000 mg | ORAL_TABLET | Freq: Every day | ORAL | 0 refills | Status: DC

## 2018-04-08 NOTE — Telephone Encounter (Signed)
IC s/w the and advised per Dr August Saucerean should keep heel in block 30min 3 times a day at minimum. Also advised that they could pick up the rx in the morning to take to the pharmacy. Patient verbalized understanding.

## 2018-04-08 NOTE — Telephone Encounter (Signed)
Patient called needing Rx refilled (Oxycodone)  Patient said he is taking 2 tabs every 4 hours. Patient said he started taking only 1 but the pain was pretty bad and started back taking two. Patient also asked for a Rx for (Eszopiclone)  So that he can sleep at night  The number to contact patient is [phone]

## 2018-04-08 NOTE — Telephone Encounter (Signed)
Please advise. Patients surgery 07/23 and he is out of meds. Thanks.

## 2018-04-08 NOTE — Telephone Encounter (Signed)
Okay for refill oxycodone 1-2 tabs every 4 to 6 hours as needed pain #45 with no refills plus one prescription for the other medicine #30 no refills at whatever dose he was taking thanks

## 2018-04-08 NOTE — Telephone Encounter (Signed)
Pt wife came into the office would like to know how long he will need Blue block to support ankle

## 2018-04-10 ENCOUNTER — Ambulatory Visit (INDEPENDENT_AMBULATORY_CARE_PROVIDER_SITE_OTHER): Payer: BC Managed Care – PPO | Admitting: Orthopedic Surgery

## 2018-04-10 ENCOUNTER — Encounter (INDEPENDENT_AMBULATORY_CARE_PROVIDER_SITE_OTHER): Payer: Self-pay | Admitting: Orthopedic Surgery

## 2018-04-10 DIAGNOSIS — M1711 Unilateral primary osteoarthritis, right knee: Secondary | ICD-10-CM

## 2018-04-10 NOTE — Progress Notes (Signed)
   Post-Op Visit Note   Patient: Christopher Ross           Date of Birth: 02/12/1956           MRN: [id number] Visit Date: 04/10/2018 PCP: Selinda FlavinHoward, Kevin, MD   Assessment & Plan:  Chief Complaint:  Chief Complaint  Patient presents with  . Right Knee - Routine Post Op   Visit Diagnoses:  1. Unilateral primary osteoarthritis, right knee     Plan: Christopher Ross is a patient is now about 8 days out right total knee replacement.  He is been doing well.  On exam incision looks intact.  Radiographs in the hospital looks good.  CPM is at 84 degrees.  Plan at this time is to do home health physical therapy transitioning to outpatient physical therapy when he is able to be a little bit more mobile.  I will see him back in about 3 weeks for clinical recheck.  Follow-Up Instructions: Return in about 1 month (around 05/08/2018).   Orders:  No orders of the defined types were placed in this encounter.  No orders of the defined types were placed in this encounter.   Imaging: No results found.  PMFS History: Patient Active Problem List   Diagnosis Date Noted  . Arthritis of knee 04/01/2018  . Unilateral primary osteoarthritis, right knee   . Prostatic hyperplasia 02/28/2018   Past Medical History:  Diagnosis Date  . Dog bite 02/27/2018   RIGHT FINGER LEFT HAND SMALL BITE AREA CLEANED WITH ALCOHOL AND BANDAID APPLIED   . Enlarged prostate   . Hyperlipidemia   . Inguinal hernia   . OA (osteoarthritis) of knee    Right  . OA (osteoarthritis) of knee    right  . Wears glasses     History reviewed. No pertinent family history.  Past Surgical History:  Procedure Laterality Date  . ANTERIOR CERVICAL DECOMP/DISCECTOMY FUSION  07/30/2012   Procedure: ANTERIOR CERVICAL DECOMPRESSION/DISCECTOMY FUSION 2 LEVELS;  Surgeon: Cristi LoronJeffrey D Jenkins, MD;  Location: [facility];  Service: Neurosurgery;  Laterality: N/A;  Cervical four-five,Cervical five-six anterior cervical decompression with fusion  interbody prothesis plating and bonegraft  . COLONOSCOPY  2015  . FRACTURE SURGERY  YRS AGO   broken left arm, repair  . INGUINAL HERNIA REPAIR Right 09/12/2017   Procedure: LAPAROSCOPIC RIGHT INGUINAL HERNIA REPAIR WITH MESH;  Surgeon: Abigail MiyamotoBlackman, Douglas, MD;  Location: [facility];  Service: General;  Laterality: Right;  . INSERTION OF MESH Right 09/12/2017   Procedure: INSERTION OF MESH;  Surgeon: Abigail MiyamotoBlackman, Douglas, MD;  Location: [facility];  Service: General;  Laterality: Right;  . TOTAL KNEE ARTHROPLASTY Right 04/01/2018   Procedure: RIGHT TOTAL KNEE ARTHROPLASTY;  Surgeon: Cammy Copaean, Edona Schreffler Scott, MD;  [facility];  Service: Orthopedics;  Laterality: Right;  . TRANSURETHRAL RESECTION OF PROSTATE N/A 02/28/2018   Procedure: TRANSURETHRAL RESECTION OF THE PROSTATE (TURP);  Surgeon: Sebastian AcheManny, Theodore, MD;  Location: [facility];  Service: Urology;  Laterality: N/A;   Social History   Occupational History  . Not on file  Tobacco Use  . Smoking status: Never Smoker  . Smokeless tobacco: Never Used  Substance and Sexual Activity  . Alcohol use: Yes    Comment: occasional   . Drug use: Never  . Sexual activity: Not on file

## 2018-04-14 ENCOUNTER — Telehealth (INDEPENDENT_AMBULATORY_CARE_PROVIDER_SITE_OTHER): Payer: Self-pay

## 2018-04-14 NOTE — Telephone Encounter (Signed)
Can you please advise on this message below? Christopher Ross patient.   Patient would like know if he can be prescribed another Rx for pain, stated that the Oxycodone is causing constipation. Patient had surgery on 04/01/18 for right TKA.  Would like to know if he needs to stop taking Oxycodone?  CB# is [phone].  Please advise.  Thank You.

## 2018-04-14 NOTE — Telephone Encounter (Signed)
Tried to call patient and advise him of message per Richardean CanalGilbert clark, but patient did not answer and I could not leave a VM due to mailbox being full.

## 2018-04-14 NOTE — Telephone Encounter (Signed)
Any narcotic can cause constipation . Take in fluids and OTC stool softner.

## 2018-04-15 ENCOUNTER — Telehealth (INDEPENDENT_AMBULATORY_CARE_PROVIDER_SITE_OTHER): Payer: Self-pay | Admitting: Orthopedic Surgery

## 2018-04-15 NOTE — Telephone Encounter (Signed)
Patient called needing an order for outpatient therapy. Patient said he want to start the therapy Friday 04/18/18. The number to contact patient is [phone]   The fax# [phone]  AC[facility] (PT) in [place]

## 2018-04-16 NOTE — Telephone Encounter (Signed)
Faxed order. Patient advised done.

## 2018-04-18 ENCOUNTER — Telehealth (INDEPENDENT_AMBULATORY_CARE_PROVIDER_SITE_OTHER): Payer: Self-pay | Admitting: Orthopedic Surgery

## 2018-04-18 NOTE — Telephone Encounter (Signed)
Patient stopped taking narcotic medicine, would like to know what type of medicine is ok to take. Patient is currently at Physical therapy and they advised patient to stop taking Ibuprofen. Patient wife would like to know if he should stop taking his chewable aspirin tablet. Christopher Ross is approximately 17 days post op and needed clarity on his next step with his medication management.

## 2018-04-18 NOTE — Telephone Encounter (Signed)
IC s/w patient and he wanted to clarify 1-was it ok to come off the oxy and take ibuprofen? I advised this was ok. 2- was it ok for him to go [facility] and do some exercises since he was ok'd by PT? I advised this was ok. 3-should he continue with aspirin? I advised yes should continue for prophylactic DVT at least until ROV to see Dr August Saucerean

## 2018-04-24 ENCOUNTER — Other Ambulatory Visit (INDEPENDENT_AMBULATORY_CARE_PROVIDER_SITE_OTHER): Payer: Self-pay | Admitting: Orthopedic Surgery

## 2018-04-24 NOTE — Telephone Encounter (Signed)
Ok to rf? 

## 2018-04-24 NOTE — Telephone Encounter (Signed)
Y needs this for 4 weeks total post op

## 2018-04-25 ENCOUNTER — Telehealth (INDEPENDENT_AMBULATORY_CARE_PROVIDER_SITE_OTHER): Payer: Self-pay | Admitting: Orthopedic Surgery

## 2018-04-25 NOTE — Telephone Encounter (Signed)
Tried calling patient, no answer. Went straight to his voicemail. Will try to reach him again on Monday.

## 2018-04-25 NOTE — Telephone Encounter (Signed)
Patient came into the clinic and requested to have you call him to ask a brief question.  CB#[phone].  Thank you

## 2018-04-28 ENCOUNTER — Ambulatory Visit (INDEPENDENT_AMBULATORY_CARE_PROVIDER_SITE_OTHER): Payer: BC Managed Care – PPO | Admitting: Orthopedic Surgery

## 2018-04-28 ENCOUNTER — Encounter (INDEPENDENT_AMBULATORY_CARE_PROVIDER_SITE_OTHER): Payer: Self-pay | Admitting: Orthopedic Surgery

## 2018-04-28 DIAGNOSIS — M1711 Unilateral primary osteoarthritis, right knee: Secondary | ICD-10-CM

## 2018-04-28 NOTE — Telephone Encounter (Signed)
Patient has an appointment this morning to see Dr August Saucerean. Can discuss at OV.

## 2018-04-28 NOTE — Progress Notes (Signed)
   Post-Op Visit Note   Patient: Christopher Ross           Date of Birth: 06/12/1956           MRN: [id number] Visit Date: 04/28/2018 PCP: Selinda FlavinHoward, Kevin, MD   Assessment & Plan:  Chief Complaint:  Chief Complaint  Patient presents with  . Right Knee - Routine Post Op   Visit Diagnoses:  1. Unilateral primary osteoarthritis, right knee     Plan: Kathlene NovemberMike is now about 3 and half weeks out right total knee replacement.  He is been doing well.  Still has about an 8 degree flexion contracture but can bend to about 90-95.  I will have him continue in therapy working on patella mobilization full extension as well as continued work on full flexion.  No effusion in the knee.  He is off pain medicines and on ibuprofen.  No calf tenderness with negative Homans today.  I will see him back in 5 weeks for clinical recheck.  Follow-Up Instructions: Return in about 5 weeks (around 06/02/2018).   Orders:  No orders of the defined types were placed in this encounter.  No orders of the defined types were placed in this encounter.   Imaging: No results found.  PMFS History: Patient Active Problem List   Diagnosis Date Noted  . Arthritis of knee 04/01/2018  . Unilateral primary osteoarthritis, right knee   . Prostatic hyperplasia 02/28/2018   Past Medical History:  Diagnosis Date  . Dog bite 02/27/2018   RIGHT FINGER LEFT HAND SMALL BITE AREA CLEANED WITH ALCOHOL AND BANDAID APPLIED   . Enlarged prostate   . Hyperlipidemia   . Inguinal hernia   . OA (osteoarthritis) of knee    Right  . OA (osteoarthritis) of knee    right  . Wears glasses     History reviewed. No pertinent family history.  Past Surgical History:  Procedure Laterality Date  . ANTERIOR CERVICAL DECOMP/DISCECTOMY FUSION  07/30/2012   Procedure: ANTERIOR CERVICAL DECOMPRESSION/DISCECTOMY FUSION 2 LEVELS;  Surgeon: Cristi LoronJeffrey D Jenkins, MD;  Location: [facility];  Service: Neurosurgery;  Laterality: N/A;  Cervical  four-five,Cervical five-six anterior cervical decompression with fusion interbody prothesis plating and bonegraft  . COLONOSCOPY  2015  . FRACTURE SURGERY  YRS AGO   broken left arm, repair  . INGUINAL HERNIA REPAIR Right 09/12/2017   Procedure: LAPAROSCOPIC RIGHT INGUINAL HERNIA REPAIR WITH MESH;  Surgeon: Abigail MiyamotoBlackman, Douglas, MD;  Location: [facility];  Service: General;  Laterality: Right;  . INSERTION OF MESH Right 09/12/2017   Procedure: INSERTION OF MESH;  Surgeon: Abigail MiyamotoBlackman, Douglas, MD;  Location: [facility];  Service: General;  Laterality: Right;  . TOTAL KNEE ARTHROPLASTY Right 04/01/2018   Procedure: RIGHT TOTAL KNEE ARTHROPLASTY;  Surgeon: Cammy Copaean, Gregory Scott, MD;  [facility];  Service: Orthopedics;  Laterality: Right;  . TRANSURETHRAL RESECTION OF PROSTATE N/A 02/28/2018   Procedure: TRANSURETHRAL RESECTION OF THE PROSTATE (TURP);  Surgeon: Sebastian AcheManny, Theodore, MD;  Location: [facility];  Service: Urology;  Laterality: N/A;   Social History   Occupational History  . Not on file  Tobacco Use  . Smoking status: Never Smoker  . Smokeless tobacco: Never Used  Substance and Sexual Activity  . Alcohol use: Yes    Comment: occasional   . Drug use: Never  . Sexual activity: Not on file

## 2018-07-22 ENCOUNTER — Telehealth (INDEPENDENT_AMBULATORY_CARE_PROVIDER_SITE_OTHER): Payer: Self-pay | Admitting: Orthopedic Surgery

## 2018-07-22 NOTE — Telephone Encounter (Signed)
Please review and advise.

## 2018-07-22 NOTE — Telephone Encounter (Signed)
Patient would like to discuss "8 pounds" that he has not been able to put back on since his surgery.  He would also like to know the weight of the hardware used for his RIGHT TKA.  (Patient has an appointment tomorrow)  cb  [phone]

## 2018-07-22 NOTE — Telephone Encounter (Signed)
The weight of the prosthesis is approximately equal to the weight of the bone removed.  In terms of the weight that he is not been able to put back on that may have to do with pain medicine

## 2018-07-23 ENCOUNTER — Ambulatory Visit (INDEPENDENT_AMBULATORY_CARE_PROVIDER_SITE_OTHER): Payer: BC Managed Care – PPO | Admitting: Orthopedic Surgery

## 2018-07-23 ENCOUNTER — Encounter (INDEPENDENT_AMBULATORY_CARE_PROVIDER_SITE_OTHER): Payer: Self-pay | Admitting: Orthopedic Surgery

## 2018-07-23 DIAGNOSIS — M1711 Unilateral primary osteoarthritis, right knee: Secondary | ICD-10-CM

## 2018-07-23 NOTE — Telephone Encounter (Signed)
Tried calling patient to discuss. No answer, VM full, unable to LM Patient has appt this afternoon can discuss at that time.

## 2018-07-26 ENCOUNTER — Encounter (INDEPENDENT_AMBULATORY_CARE_PROVIDER_SITE_OTHER): Payer: Self-pay | Admitting: Orthopedic Surgery

## 2018-07-26 NOTE — Progress Notes (Signed)
   Post-Op Visit Note   Patient: Christopher Ross           Date of Birth: 04/27/1956           MRN: [id number] Visit Date: 07/23/2018 PCP: Selinda FlavinHoward, Kevin, MD   Assessment & Plan:  Chief Complaint:  Chief Complaint  Patient presents with  . Right Knee - Follow-up   Visit Diagnoses:  1. Unilateral primary osteoarthritis, right knee     Plan: Christopher Ross is now about 4 months out right total knee replacement.  He is doing well.  He is pleased with his progress.  He is back to doing real estate.  He does do a lot of exercises in the gym.  On exam he has full extension and about 115 of flexion.  Quad strength is excellent and gait is normal.  Plan at this time is for antibiotic prophylaxis before any dental procedures or any invasive procedures for the next 2 years.  I will see him back as needed.  He is happy with his surgical result at this time.  Follow-Up Instructions: Return if symptoms worsen or fail to improve.   Orders:  No orders of the defined types were placed in this encounter.  No orders of the defined types were placed in this encounter.   Imaging: No results found.  PMFS History: Patient Active Problem List   Diagnosis Date Noted  . Arthritis of knee 04/01/2018  . Unilateral primary osteoarthritis, right knee   . Prostatic hyperplasia 02/28/2018   Past Medical History:  Diagnosis Date  . Dog bite 02/27/2018   RIGHT FINGER LEFT HAND SMALL BITE AREA CLEANED WITH ALCOHOL AND BANDAID APPLIED   . Enlarged prostate   . Hyperlipidemia   . Inguinal hernia   . OA (osteoarthritis) of knee    Right  . OA (osteoarthritis) of knee    right  . Wears glasses     History reviewed. No pertinent family history.  Past Surgical History:  Procedure Laterality Date  . ANTERIOR CERVICAL DECOMP/DISCECTOMY FUSION  07/30/2012   Procedure: ANTERIOR CERVICAL DECOMPRESSION/DISCECTOMY FUSION 2 LEVELS;  Surgeon: Cristi LoronJeffrey D Jenkins, MD;  Location: [facility];  Service: Neurosurgery;   Laterality: N/A;  Cervical four-five,Cervical five-six anterior cervical decompression with fusion interbody prothesis plating and bonegraft  . COLONOSCOPY  2015  . FRACTURE SURGERY  YRS AGO   broken left arm, repair  . INGUINAL HERNIA REPAIR Right 09/12/2017   Procedure: LAPAROSCOPIC RIGHT INGUINAL HERNIA REPAIR WITH MESH;  Surgeon: Abigail MiyamotoBlackman, Douglas, MD;  Location: [facility];  Service: General;  Laterality: Right;  . INSERTION OF MESH Right 09/12/2017   Procedure: INSERTION OF MESH;  Surgeon: Abigail MiyamotoBlackman, Douglas, MD;  Location: [facility];  Service: General;  Laterality: Right;  . TOTAL KNEE ARTHROPLASTY Right 04/01/2018   Procedure: RIGHT TOTAL KNEE ARTHROPLASTY;  Surgeon: Cammy Copaean, Bobbye Reinitz Scott, MD;  [facility];  Service: Orthopedics;  Laterality: Right;  . TRANSURETHRAL RESECTION OF PROSTATE N/A 02/28/2018   Procedure: TRANSURETHRAL RESECTION OF THE PROSTATE (TURP);  Surgeon: Sebastian AcheManny, Theodore, MD;  Location: [facility];  Service: Urology;  Laterality: N/A;   Social History   Occupational History  . Not on file  Tobacco Use  . Smoking status: Never Smoker  . Smokeless tobacco: Never Used  Substance and Sexual Activity  . Alcohol use: Yes    Comment: occasional   . Drug use: Never  . Sexual activity: Not on file

## 2018-09-18 ENCOUNTER — Institutional Professional Consult (permissible substitution): Payer: BC Managed Care – PPO | Admitting: Pulmonary Disease

## 2019-08-17 IMAGING — DX DG KNEE 1-2V PORT*R*
2 series · 2 of 2 positions shown · non-contrast
Comparison: None.

CLINICAL DATA: Pain, 2 days total arthroplasty

EXAM:
PORTABLE RIGHT KNEE - 1-2 VIEW

[knee ap]
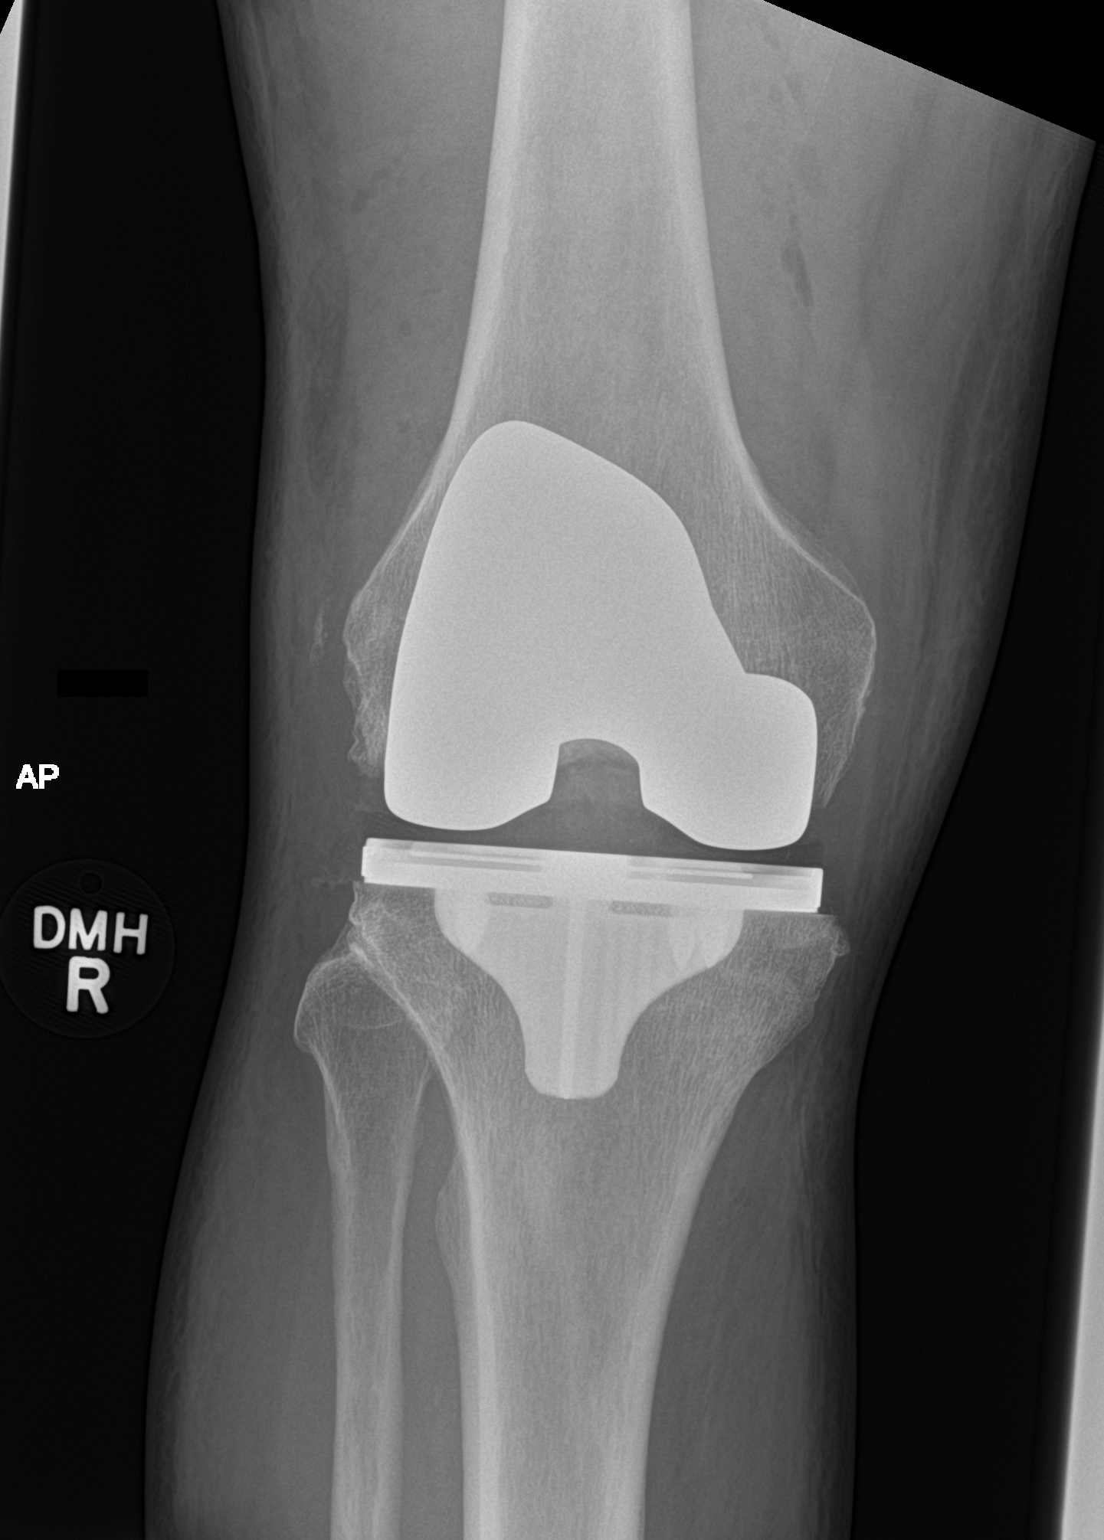

[knee lat]
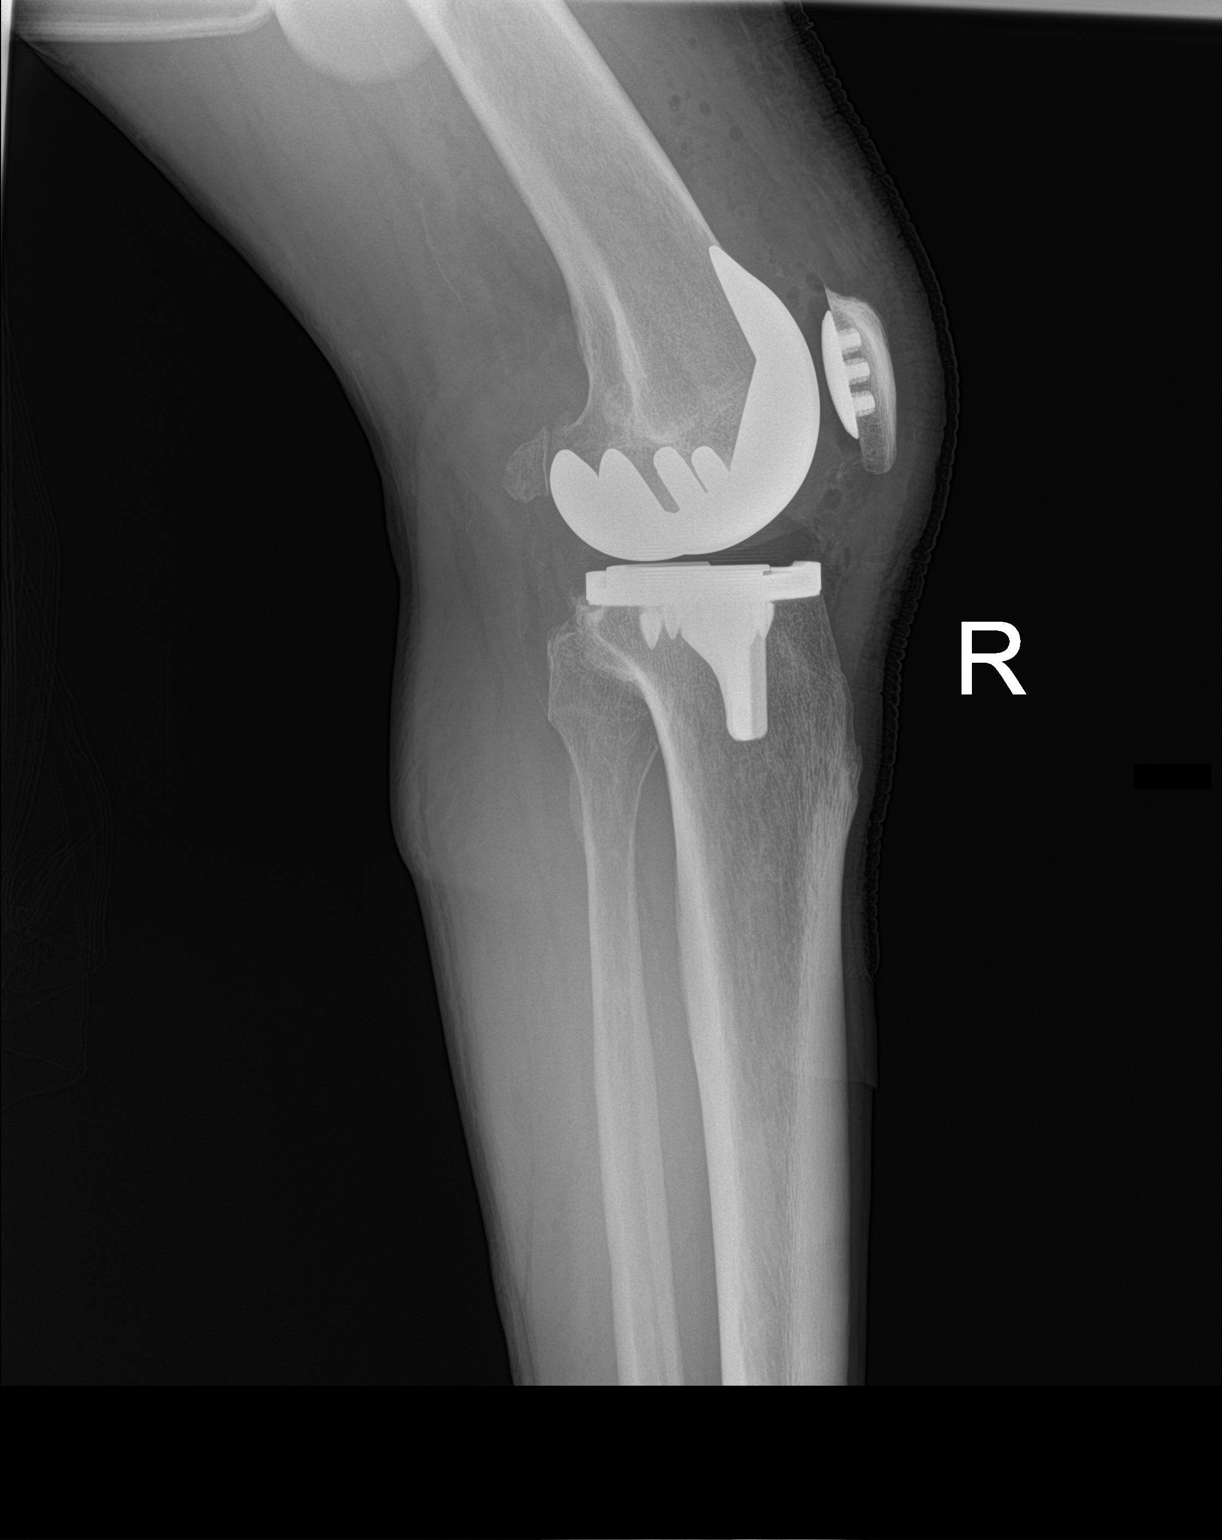

[2 of 2 positions shown; findings below may reference images not displayed]

FINDINGS: Frontal and lateral views were obtained. Patient is status post
total knee replacement with prosthetic components well-seated. No
acute fracture or dislocation. No erosive change. Foci of air within
the joint is an expected postoperative finding. A small amount of
calcification is noted lateral the distal femoral condyle, likely of
postoperative etiology.
IMPRESSION: Status post total knee replacement with prosthetic components
well-seated. No acute fracture or dislocation. Postoperative air
noted.

## 2019-09-30 ENCOUNTER — Ambulatory Visit: Payer: Medicare PPO | Attending: Internal Medicine

## 2019-09-30 DIAGNOSIS — Z23 Encounter for immunization: Secondary | ICD-10-CM | POA: Insufficient documentation

## 2019-09-30 NOTE — Progress Notes (Signed)
   Covid-19 Vaccination Clinic  Name:  Christopher Ross    MRN: [id number] DOB: 09-02-1956  09/30/2019  [name] was observed post Covid-19 immunization for 15 minutes without incidence. He was provided with Vaccine Information Sheet and instruction to access the V-Safe system.   [name] was instructed to call 911 with any severe reactions post vaccine: Marland Kitchen Difficulty breathing  . Swelling of your face and throat  . A fast heartbeat  . A bad rash all over your body  . Dizziness and weakness    Immunizations Administered    Name Date Dose VIS Date Route   Pfizer COVID-19 Vaccine 09/30/2019  6:05 PM 0.3 mL 08/21/2019 Intramuscular   Manufacturer: [employer], [employer]   Lot: [id number]   NDC: [address]-8

## 2019-10-18 ENCOUNTER — Ambulatory Visit: Payer: Self-pay

## 2019-10-18 ENCOUNTER — Ambulatory Visit: Payer: Medicare PPO | Attending: Internal Medicine

## 2019-10-18 DIAGNOSIS — Z23 Encounter for immunization: Secondary | ICD-10-CM

## 2019-10-18 NOTE — Progress Notes (Signed)
   Covid-19 Vaccination Clinic  Name:  JAMAN ARO    MRN: [id number] DOB: 12-31-1955  10/18/2019  Mr. Paye was observed post Covid-19 immunization for 15 minutes without incidence. He was provided with Vaccine Information Sheet and instruction to access the V-Safe system.   Mr. Housel was instructed to call 911 with any severe reactions post vaccine: Marland Kitchen Difficulty breathing  . Swelling of your face and throat  . A fast heartbeat  . A bad rash all over your body  . Dizziness and weakness    Immunizations Administered    Name Date Dose VIS Date Route   Pfizer COVID-19 Vaccine 10/18/2019 10:24 AM 0.3 mL 08/21/2019 Intramuscular   Manufacturer: [employer], [employer]   Lot: [id number]   NDC: [address]-3

## 2019-10-23 ENCOUNTER — Ambulatory Visit: Payer: Medicare PPO

## 2020-02-17 ENCOUNTER — Ambulatory Visit: Payer: Self-pay

## 2020-02-17 ENCOUNTER — Ambulatory Visit (INDEPENDENT_AMBULATORY_CARE_PROVIDER_SITE_OTHER): Payer: Medicare PPO | Admitting: Orthopedic Surgery

## 2020-02-17 VITALS — Ht 70.0 in | Wt 175.0 lb

## 2020-02-17 DIAGNOSIS — M1712 Unilateral primary osteoarthritis, left knee: Secondary | ICD-10-CM

## 2020-02-17 DIAGNOSIS — M25562 Pain in left knee: Secondary | ICD-10-CM

## 2020-02-19 ENCOUNTER — Encounter: Payer: Self-pay | Admitting: Orthopedic Surgery

## 2020-02-19 DIAGNOSIS — M1712 Unilateral primary osteoarthritis, left knee: Secondary | ICD-10-CM

## 2020-02-19 MED ORDER — METHYLPREDNISOLONE ACETATE 40 MG/ML IJ SUSP
40.0000 mg | INTRAMUSCULAR | Status: AC | PRN
  Administered 2020-02-19: 40 mg via INTRA_ARTICULAR

## 2020-02-19 MED ORDER — LIDOCAINE HCL 1 % IJ SOLN
5.0000 mL | INTRAMUSCULAR | Status: AC | PRN
  Administered 2020-02-19: 5 mL

## 2020-02-19 MED ORDER — BUPIVACAINE HCL 0.25 % IJ SOLN
4.0000 mL | INTRAMUSCULAR | Status: AC | PRN
  Administered 2020-02-19: 4 mL via INTRA_ARTICULAR

## 2020-02-19 NOTE — Progress Notes (Signed)
Office Visit Note   Patient: Christopher Ross           Date of Birth: Jan 28, 1956           MRN: [id number] Visit Date: 02/17/2020 Requested by: Rory Percy, MD [place],  [place] [address] PCP: Curlene Labrum, MD  Subjective: Chief Complaint  Patient presents with  . Left Knee - Pain    HPI: Christopher Ross is a 64 year old patient with left knee pain of 2 weeks duration.  He is doing well with his right total knee replacement.  He is a Forensic psychologist.  Reports a lot of pain but no mechanical symptoms.  Localizes the pain generally globally within that left knee.              ROS: All systems reviewed are negative as they relate to the chief complaint within the history of present illness.  Patient denies  fevers or chills.   Assessment & Plan: Visit Diagnoses:  1. Left knee pain, unspecified chronicity     Plan: Impression is left knee arthritis with medial joint space narrowing.  Not nearly as severe radiographically as the right knee was prior to surgery.  For only 2 weeks of symptoms I would recommend a cortisone injection to see if we can calm this down.  We will check him back in about 6 to 8 weeks if he is not improving.  Could consider gel injection at that time.  Follow-Up Instructions: Return if symptoms worsen or fail to improve.   Orders:  Orders Placed This Encounter  Procedures  . XR KNEE 3 VIEW LEFT   No orders of the defined types were placed in this encounter.     Procedures: Large Joint Inj: L knee on 02/19/2020 11:19 PM Indications: diagnostic evaluation, joint swelling and pain Details: 18 G 1.5 in needle, superolateral approach  Arthrogram: No  Medications: 5 mL lidocaine 1 %; 40 mg methylPREDNISolone acetate 40 MG/ML; 4 mL bupivacaine 0.25 % Outcome: tolerated well, no immediate complications Procedure, treatment alternatives, risks and benefits explained, specific risks discussed. Consent was given by the patient. Immediately prior to  procedure a time out was called to verify the correct patient, procedure, equipment, support staff and site/side marked as required. Patient was prepped and draped in the usual sterile fashion.       Clinical Data: No additional findings.  Objective: Vital Signs: Ht 5\' 10"  (1.778 m)   Wt 175 lb (79.4 kg)   BMI 25.11 kg/m   Physical Exam:   Constitutional: Patient appears well-developed HEENT:  Head: Normocephalic Eyes:EOM are normal Neck: Normal range of motion Cardiovascular: Normal rate Pulmonary/chest: Effort normal Neurologic: Patient is alert Skin: Skin is warm Psychiatric: Patient has normal mood and affect    Ortho Exam: Ortho exam demonstrates full active and passive range of motion of that left knee.  Slight varus alignment present.  Pedal pulses palpable.  No groin pain with internal extra rotation of that left leg.  Medial greater than lateral joint line tenderness is present.  Extensor mechanism and cruciate ligaments intact.  No effusion.  Specialty Comments:  No specialty comments available.  Imaging: No results found.   PMFS History: Patient Active Problem List   Diagnosis Date Noted  . Arthritis of knee 04/01/2018  . Unilateral primary osteoarthritis, right knee   . Prostatic hyperplasia 02/28/2018   Past Medical History:  Diagnosis Date  . Dog bite 02/27/2018   RIGHT FINGER LEFT HAND  SMALL BITE AREA CLEANED WITH ALCOHOL AND BANDAID APPLIED   . Enlarged prostate   . Hyperlipidemia   . Inguinal hernia   . OA (osteoarthritis) of knee    Right  . OA (osteoarthritis) of knee    right  . Wears glasses     History reviewed. No pertinent family history.  Past Surgical History:  Procedure Laterality Date  . ANTERIOR CERVICAL DECOMP/DISCECTOMY FUSION  07/30/2012   Procedure: ANTERIOR CERVICAL DECOMPRESSION/DISCECTOMY FUSION 2 LEVELS;  Surgeon: Cristi Loron, MD;  Location: [facility];  Service: Neurosurgery;  Laterality: N/A;  Cervical  four-five,Cervical five-six anterior cervical decompression with fusion interbody prothesis plating and bonegraft  . COLONOSCOPY  2015  . FRACTURE SURGERY  YRS AGO   broken left arm, repair  . INGUINAL HERNIA REPAIR Right 09/12/2017   Procedure: LAPAROSCOPIC RIGHT INGUINAL HERNIA REPAIR WITH MESH;  Surgeon: Abigail Miyamoto, MD;  Location: [facility];  Service: General;  Laterality: Right;  . INSERTION OF MESH Right 09/12/2017   Procedure: INSERTION OF MESH;  Surgeon: Abigail Miyamoto, MD;  Location: [facility];  Service: General;  Laterality: Right;  . TOTAL KNEE ARTHROPLASTY Right 04/01/2018   Procedure: RIGHT TOTAL KNEE ARTHROPLASTY;  Surgeon: Cammy Copa, MD;  Location: [facility] OR;  Service: Orthopedics;  Laterality: Right;  . TRANSURETHRAL RESECTION OF PROSTATE N/A 02/28/2018   Procedure: TRANSURETHRAL RESECTION OF THE PROSTATE (TURP);  Surgeon: Sebastian Ache, MD;  Location: [facility];  Service: Urology;  Laterality: N/A;   Social History   Occupational History  . Not on file  Tobacco Use  . Smoking status: Never Smoker  . Smokeless tobacco: Never Used  Vaping Use  . Vaping Use: Never used  Substance and Sexual Activity  . Alcohol use: Yes    Comment: occasional   . Drug use: Never  . Sexual activity: Not on file

## 2020-05-26 ENCOUNTER — Ambulatory Visit: Payer: Medicare PPO | Attending: Internal Medicine

## 2020-05-26 DIAGNOSIS — Z23 Encounter for immunization: Secondary | ICD-10-CM

## 2020-05-26 NOTE — Progress Notes (Signed)
   Covid-19 Vaccination Clinic  Name:  Christopher Ross    MRN: [id number] DOB: 10/10/1955  05/26/2020  Christopher Ross was observed post Covid-19 immunization for 15 minutes without incident. He was provided with Vaccine Information Sheet and instruction to access the V-Safe system.   Christopher Ross was instructed to call 911 with any severe reactions post vaccine: Marland Kitchen Difficulty breathing  . Swelling of face and throat  . A fast heartbeat  . A bad rash all over body  . Dizziness and weakness

## 2021-01-12 ENCOUNTER — Other Ambulatory Visit: Payer: Self-pay | Admitting: Neurosurgery

## 2021-01-12 DIAGNOSIS — R29898 Other symptoms and signs involving the musculoskeletal system: Secondary | ICD-10-CM

## 2021-01-28 ENCOUNTER — Other Ambulatory Visit: Payer: Medicare PPO

## 2021-02-02 ENCOUNTER — Other Ambulatory Visit: Payer: Medicare PPO

## 2021-02-05 ENCOUNTER — Other Ambulatory Visit: Payer: Medicare PPO

## 2021-08-07 DIAGNOSIS — J40 Bronchitis, not specified as acute or chronic: Secondary | ICD-10-CM | POA: Diagnosis not present

## 2021-08-07 DIAGNOSIS — J329 Chronic sinusitis, unspecified: Secondary | ICD-10-CM | POA: Diagnosis not present

## 2021-08-07 DIAGNOSIS — Z20828 Contact with and (suspected) exposure to other viral communicable diseases: Secondary | ICD-10-CM | POA: Diagnosis not present

## 2021-09-12 DIAGNOSIS — Z20828 Contact with and (suspected) exposure to other viral communicable diseases: Secondary | ICD-10-CM | POA: Diagnosis not present

## 2021-10-28 DIAGNOSIS — R509 Fever, unspecified: Secondary | ICD-10-CM | POA: Diagnosis not present

## 2021-10-28 DIAGNOSIS — Z20828 Contact with and (suspected) exposure to other viral communicable diseases: Secondary | ICD-10-CM | POA: Diagnosis not present

## 2021-10-28 DIAGNOSIS — R059 Cough, unspecified: Secondary | ICD-10-CM | POA: Diagnosis not present

## 2022-03-29 DIAGNOSIS — L570 Actinic keratosis: Secondary | ICD-10-CM | POA: Diagnosis not present

## 2022-03-29 DIAGNOSIS — X32XXXD Exposure to sunlight, subsequent encounter: Secondary | ICD-10-CM | POA: Diagnosis not present

## 2022-03-29 DIAGNOSIS — L280 Lichen simplex chronicus: Secondary | ICD-10-CM | POA: Diagnosis not present

## 2022-05-15 DIAGNOSIS — Z6825 Body mass index (BMI) 25.0-25.9, adult: Secondary | ICD-10-CM | POA: Diagnosis not present

## 2022-05-15 DIAGNOSIS — L309 Dermatitis, unspecified: Secondary | ICD-10-CM | POA: Diagnosis not present

## 2022-05-15 DIAGNOSIS — M5412 Radiculopathy, cervical region: Secondary | ICD-10-CM | POA: Diagnosis not present

## 2022-05-15 DIAGNOSIS — F40240 Claustrophobia: Secondary | ICD-10-CM | POA: Diagnosis not present

## 2022-05-24 DIAGNOSIS — X32XXXD Exposure to sunlight, subsequent encounter: Secondary | ICD-10-CM | POA: Diagnosis not present

## 2022-05-24 DIAGNOSIS — L570 Actinic keratosis: Secondary | ICD-10-CM | POA: Diagnosis not present

## 2022-07-03 DIAGNOSIS — R202 Paresthesia of skin: Secondary | ICD-10-CM | POA: Diagnosis not present

## 2022-07-09 ENCOUNTER — Other Ambulatory Visit: Payer: Self-pay | Admitting: Student

## 2022-07-09 DIAGNOSIS — R202 Paresthesia of skin: Secondary | ICD-10-CM

## 2022-07-17 DIAGNOSIS — H16142 Punctate keratitis, left eye: Secondary | ICD-10-CM | POA: Diagnosis not present

## 2022-07-17 DIAGNOSIS — H16122 Filamentary keratitis, left eye: Secondary | ICD-10-CM | POA: Diagnosis not present

## 2022-07-28 ENCOUNTER — Ambulatory Visit
Admission: RE | Admit: 2022-07-28 | Discharge: 2022-07-28 | Disposition: A | Discharge status: Home / Self Care | Payer: Medicare PPO | Source: Ambulatory Visit | Attending: Student | Admitting: Student

## 2022-07-28 DIAGNOSIS — R202 Paresthesia of skin: Secondary | ICD-10-CM

## 2022-08-13 DIAGNOSIS — F40240 Claustrophobia: Secondary | ICD-10-CM | POA: Diagnosis not present

## 2022-08-13 DIAGNOSIS — R030 Elevated blood-pressure reading, without diagnosis of hypertension: Secondary | ICD-10-CM | POA: Diagnosis not present

## 2022-08-13 DIAGNOSIS — Z6825 Body mass index (BMI) 25.0-25.9, adult: Secondary | ICD-10-CM | POA: Diagnosis not present

## 2022-09-01 ENCOUNTER — Ambulatory Visit
Admission: RE | Admit: 2022-09-01 | Discharge: 2022-09-01 | Disposition: A | Discharge status: Home / Self Care | Payer: Medicare PPO | Source: Ambulatory Visit | Attending: Student | Admitting: Student

## 2022-09-01 DIAGNOSIS — M4802 Spinal stenosis, cervical region: Secondary | ICD-10-CM | POA: Diagnosis not present

## 2022-09-07 DIAGNOSIS — M4802 Spinal stenosis, cervical region: Secondary | ICD-10-CM | POA: Diagnosis not present

## 2022-09-07 DIAGNOSIS — R202 Paresthesia of skin: Secondary | ICD-10-CM | POA: Diagnosis not present

## 2022-09-07 DIAGNOSIS — M47812 Spondylosis without myelopathy or radiculopathy, cervical region: Secondary | ICD-10-CM | POA: Diagnosis not present

## 2022-11-08 DIAGNOSIS — H2513 Age-related nuclear cataract, bilateral: Secondary | ICD-10-CM | POA: Diagnosis not present

## 2022-11-08 DIAGNOSIS — H52223 Regular astigmatism, bilateral: Secondary | ICD-10-CM | POA: Diagnosis not present

## 2022-11-08 DIAGNOSIS — H524 Presbyopia: Secondary | ICD-10-CM | POA: Diagnosis not present

## 2022-11-08 DIAGNOSIS — H5213 Myopia, bilateral: Secondary | ICD-10-CM | POA: Diagnosis not present

## 2022-11-13 DIAGNOSIS — R202 Paresthesia of skin: Secondary | ICD-10-CM | POA: Diagnosis not present

## 2023-03-02 DIAGNOSIS — R109 Unspecified abdominal pain: Secondary | ICD-10-CM | POA: Diagnosis not present

## 2023-03-02 DIAGNOSIS — E860 Dehydration: Secondary | ICD-10-CM | POA: Diagnosis not present

## 2023-03-02 DIAGNOSIS — Z6825 Body mass index (BMI) 25.0-25.9, adult: Secondary | ICD-10-CM | POA: Diagnosis not present

## 2023-03-02 DIAGNOSIS — Z833 Family history of diabetes mellitus: Secondary | ICD-10-CM | POA: Diagnosis not present

## 2023-03-02 DIAGNOSIS — R030 Elevated blood-pressure reading, without diagnosis of hypertension: Secondary | ICD-10-CM | POA: Diagnosis not present

## 2023-03-02 DIAGNOSIS — R632 Polyphagia: Secondary | ICD-10-CM | POA: Diagnosis not present

## 2023-04-04 DIAGNOSIS — M4800 Spinal stenosis, site unspecified: Secondary | ICD-10-CM | POA: Diagnosis not present

## 2023-04-04 DIAGNOSIS — G4700 Insomnia, unspecified: Secondary | ICD-10-CM | POA: Diagnosis not present

## 2023-04-04 DIAGNOSIS — E785 Hyperlipidemia, unspecified: Secondary | ICD-10-CM | POA: Diagnosis not present

## 2023-04-04 DIAGNOSIS — I10 Essential (primary) hypertension: Secondary | ICD-10-CM | POA: Diagnosis not present

## 2023-04-04 DIAGNOSIS — M1990 Unspecified osteoarthritis, unspecified site: Secondary | ICD-10-CM | POA: Diagnosis not present

## 2023-08-12 DIAGNOSIS — R030 Elevated blood-pressure reading, without diagnosis of hypertension: Secondary | ICD-10-CM | POA: Diagnosis not present

## 2023-08-12 DIAGNOSIS — Z6825 Body mass index (BMI) 25.0-25.9, adult: Secondary | ICD-10-CM | POA: Diagnosis not present

## 2023-08-12 DIAGNOSIS — G629 Polyneuropathy, unspecified: Secondary | ICD-10-CM | POA: Diagnosis not present

## 2023-08-12 DIAGNOSIS — M25569 Pain in unspecified knee: Secondary | ICD-10-CM | POA: Diagnosis not present

## 2023-08-19 ENCOUNTER — Ambulatory Visit: Payer: Medicare PPO | Admitting: Orthopedic Surgery

## 2023-08-19 DIAGNOSIS — M1712 Unilateral primary osteoarthritis, left knee: Secondary | ICD-10-CM

## 2023-08-20 ENCOUNTER — Encounter: Payer: Self-pay | Admitting: Orthopedic Surgery

## 2023-08-20 NOTE — Progress Notes (Unsigned)
Office Visit Note   Patient: Christopher Ross           Date of Birth: 14-Dec-1955           MRN: [id number] Visit Date: 08/19/2023 Requested by: Juliette Alcide, MD [address] [place],  [place] [address] PCP: Juliette Alcide, MD  Subjective: Chief Complaint  Patient presents with   Left Knee - Pain    HPI: Christopher Ross is a 67 y.o. male who presents to the office reporting left knee pain.  Last seen 02/17/2020.  Reports rather significant left knee pain for the past several weeks.  Takes very infrequent over-the-counter medication for pain.  Had prior right total knee replacement 6 years ago which she has done well with.  He works in Research officer, political party.  He is very active.  Describes increased pain with activity.  Patient states "I want to get my knee fixed".  Does report stiffness in the knee in the morning..                ROS: All systems reviewed are negative as they relate to the chief complaint within the history of present illness.  Patient denies fevers or chills.  Assessment & Plan: Visit Diagnoses: No diagnosis found.  Plan: Impression is left knee arthritis with well-functioning right total knee replacement.  Patient would like to have his knee replaced.  He does have significant bone-on-bone arthritis mostly in the medial compartment but also in the patellofemoral compartment.  He is very active and very fit.  He would like to do his own physical therapy with this knee replacement.  Risk benefits are discussed including not limited to infection nerve vessel damage knee stiffness incomplete pain relief as well as incomplete functional restoration.  Patient understands and wishes to proceed.  Will get this done for him sometime in January.  All questions answered.  No personal or family history of PE or deep vein thrombosis.  Follow-Up Instructions: No follow-ups on file.   Orders:  No orders of the defined types were placed in this encounter.  No orders of the defined types were  placed in this encounter.     Procedures: No procedures performed   Clinical Data: No additional findings.  Objective: Vital Signs: There were no vitals taken for this visit.  Physical Exam:  Constitutional: Patient appears well-developed HEENT:  Head: Normocephalic Eyes:EOM are normal Neck: Normal range of motion Cardiovascular: Normal rate Pulmonary/chest: Effort normal Neurologic: Patient is alert Skin: Skin is warm Psychiatric: Patient has normal mood and affect  Ortho Exam: Ortho exam demonstrates normal gait alignment.  Has excellent quad tone in both legs.  Lacks about 7 degrees of full extension on the left and he does have mild varus alignment.  Collateral crucial ligaments are stable.  Has medial greater than lateral joint line tenderness.  No groin pain with internal/external rotation of that left leg.  Pedal pulses palpable.  Ankle dorsiflexion intact.  Patellofemoral crepitus is present.  Trace effusion present.  Specialty Comments:  No specialty comments available.  Imaging: No results found.   PMFS History: Patient Active Problem List   Diagnosis Date Noted   Arthritis of knee 04/01/2018   Unilateral primary osteoarthritis, right knee    Prostatic hyperplasia 02/28/2018   Past Medical History:  Diagnosis Date   Dog bite 02/27/2018   RIGHT FINGER LEFT HAND SMALL BITE AREA CLEANED WITH ALCOHOL AND BANDAID APPLIED    Enlarged prostate  Hyperlipidemia    Inguinal hernia    OA (osteoarthritis) of knee    Right   OA (osteoarthritis) of knee    right   Wears glasses     History reviewed. No pertinent family history.  Past Surgical History:  Procedure Laterality Date   ANTERIOR CERVICAL DECOMP/DISCECTOMY FUSION  07/30/2012   Procedure: ANTERIOR CERVICAL DECOMPRESSION/DISCECTOMY FUSION 2 LEVELS;  Surgeon: Cristi Loron, MD;  Location: [facility];  Service: Neurosurgery;  Laterality: N/A;  Cervical four-five,Cervical five-six anterior cervical  decompression with fusion interbody prothesis plating and bonegraft   COLONOSCOPY  2015   FRACTURE SURGERY  YRS AGO   broken left arm, repair   INGUINAL HERNIA REPAIR Right 09/12/2017   Procedure: LAPAROSCOPIC RIGHT INGUINAL HERNIA REPAIR WITH MESH;  Surgeon: Abigail Miyamoto, MD;  Location: [facility];  Service: General;  Laterality: Right;   INSERTION OF MESH Right 09/12/2017   Procedure: INSERTION OF MESH;  Surgeon: Abigail Miyamoto, MD;  Location: [facility];  Service: General;  Laterality: Right;   TOTAL KNEE ARTHROPLASTY Right 04/01/2018   Procedure: RIGHT TOTAL KNEE ARTHROPLASTY;  Surgeon: Cammy Copa, MD;  Location: [facility] OR;  Service: Orthopedics;  Laterality: Right;   TRANSURETHRAL RESECTION OF PROSTATE N/A 02/28/2018   Procedure: TRANSURETHRAL RESECTION OF THE PROSTATE (TURP);  Surgeon: Sebastian Ache, MD;  Location: [facility];  Service: Urology;  Laterality: N/A;   Social History   Occupational History   Not on file  Tobacco Use   Smoking status: Never   Smokeless tobacco: Never  Vaping Use   Vaping status: Never Used  Substance and Sexual Activity   Alcohol use: Yes    Comment: occasional    Drug use: Never   Sexual activity: Not on file

## 2023-08-23 ENCOUNTER — Ambulatory Visit: Payer: Medicare PPO | Admitting: Orthopedic Surgery

## 2023-09-17 ENCOUNTER — Telehealth: Payer: Self-pay | Admitting: *Deleted

## 2023-09-17 NOTE — Care Plan (Signed)
 [facility] RNCM call to patient to discuss his upcoming left total knee arthroplasty with Dr. Addie on 09/19/23 at [facility]. This surgery was rescheduled to a sooner date from 10/07/23. He is agreeable to case management. He anticipates returning home with assistance from his wife. He will need a home CPM and RW. Referral made to [facility] and hopefully these will be provided prior to surgery. May need RW before discharge home if [employer] unable to get DME to him prior to surgery. He requested to go straight to OPPT after discharge home. Referral sent to [facility] in [place]. Reviewed all post op care instructions. Will continue to follow for needs.

## 2023-09-17 NOTE — Telephone Encounter (Signed)
 [facility] pre-op call completed.

## 2023-09-18 ENCOUNTER — Other Ambulatory Visit: Payer: Self-pay

## 2023-09-18 ENCOUNTER — Encounter (HOSPITAL_COMMUNITY): Payer: Self-pay | Admitting: Orthopedic Surgery

## 2023-09-18 NOTE — Progress Notes (Signed)
 PCP - Dr Elspeth Messier Cardiologist - none  Chest x-ray - n/a EKG - n/a Stress Test - n/a ECHO - n/a Cardiac Cath - n/a  ICD Pacemaker/Loop - n/a  Sleep Study -  n/a CPAP - none  Diabetes - n/a  Blood Thinner Instructions:  n/a  Aspirin  Instructions: n/a  ERAS - Clear liquids til 4:30 AM DOS  Anesthesia review: No  STOP now taking any Aspirin  (unless otherwise instructed by your surgeon), Aleve, Naproxen, Ibuprofen, Motrin, Advil, Goody's, BC's, all herbal medications, fish oil, and all vitamins.   Coronavirus Screening Do you have any of the following symptoms:  Cough yes/no: No Fever (>100.29F)  yes/no: No Runny nose yes/no: No Sore throat yes/no: No Difficulty breathing/shortness of breath  yes/no: No  Have you traveled in the last 14 days and where? yes/no: No  Patient verbalized understanding of instructions that were given via phone.

## 2023-09-18 NOTE — Anesthesia Preprocedure Evaluation (Addendum)
 Anesthesia Evaluation  Patient identified by MRN, date of birth, ID band Patient awake    Reviewed: Allergy & Precautions, NPO status , Patient's Chart, lab work & pertinent test results  Airway Mallampati: III  TM Distance: >3 FB Neck ROM: Full  Mouth opening: Limited Mouth Opening  Dental no notable dental hx. (+) Teeth Intact, Dental Advisory Given   Pulmonary neg pulmonary ROS   Pulmonary exam normal breath sounds clear to auscultation       Cardiovascular negative cardio ROS Normal cardiovascular exam Rhythm:Regular Rate:Normal  HLD   Neuro/Psych  PSYCHIATRIC DISORDERS Anxiety     negative neurological ROS     GI/Hepatic negative GI ROS, Neg liver ROS,,,  Endo/Other  negative endocrine ROS    Renal/GU negative Renal ROS  negative genitourinary   Musculoskeletal  (+) Arthritis ,    Abdominal   Peds  (+) ADHD Hematology negative hematology ROS (+)   Anesthesia Other Findings   Reproductive/Obstetrics                             Anesthesia Physical Anesthesia Plan  ASA: 2  Anesthesia Plan: Spinal and Regional   Post-op Pain Management: Regional block* and Tylenol  PO (pre-op)*   Induction:   PONV Risk Score and Plan: 1 and Treatment may vary due to age or medical condition, Midazolam , Propofol  infusion, Ondansetron  and Dexamethasone   Airway Management Planned: Natural Airway  Additional Equipment:   Intra-op Plan:   Post-operative Plan:   Informed Consent: I have reviewed the patients History and Physical, chart, labs and discussed the procedure including the risks, benefits and alternatives for the proposed anesthesia with the patient or authorized representative who has indicated his/her understanding and acceptance.     Dental advisory given  Plan Discussed with: CRNA  Anesthesia Plan Comments:        Anesthesia Quick Evaluation

## 2023-09-19 ENCOUNTER — Observation Stay (HOSPITAL_COMMUNITY)
Admission: RE | Admit: 2023-09-19 | Discharge: 2023-09-20 | Disposition: A | Discharge status: Home / Self Care | Payer: Medicare PPO | Source: Ambulatory Visit | Attending: Orthopedic Surgery | Admitting: Orthopedic Surgery

## 2023-09-19 ENCOUNTER — Other Ambulatory Visit: Payer: Self-pay

## 2023-09-19 ENCOUNTER — Ambulatory Visit (HOSPITAL_COMMUNITY): Payer: Medicare PPO | Admitting: Anesthesiology

## 2023-09-19 ENCOUNTER — Encounter (HOSPITAL_COMMUNITY)
Admission: RE | Disposition: A | Discharge status: Home / Self Care | Payer: Self-pay | Source: Ambulatory Visit | Attending: Orthopedic Surgery

## 2023-09-19 ENCOUNTER — Encounter (HOSPITAL_COMMUNITY): Payer: Self-pay | Admitting: Orthopedic Surgery

## 2023-09-19 DIAGNOSIS — Z96652 Presence of left artificial knee joint: Secondary | ICD-10-CM

## 2023-09-19 DIAGNOSIS — Z96651 Presence of right artificial knee joint: Secondary | ICD-10-CM | POA: Diagnosis not present

## 2023-09-19 DIAGNOSIS — Z01818 Encounter for other preprocedural examination: Principal | ICD-10-CM

## 2023-09-19 DIAGNOSIS — G8918 Other acute postprocedural pain: Secondary | ICD-10-CM | POA: Diagnosis not present

## 2023-09-19 DIAGNOSIS — M1712 Unilateral primary osteoarthritis, left knee: Secondary | ICD-10-CM | POA: Diagnosis not present

## 2023-09-19 DIAGNOSIS — M179 Osteoarthritis of knee, unspecified: Secondary | ICD-10-CM | POA: Diagnosis present

## 2023-09-19 HISTORY — DX: Anxiety disorder, unspecified: F41.9

## 2023-09-19 HISTORY — PX: TOTAL KNEE ARTHROPLASTY: SHX125

## 2023-09-19 HISTORY — DX: Attention-deficit hyperactivity disorder, unspecified type: F90.9

## 2023-09-19 LAB — CBC
HCT: 43.3 % (ref 39.0–52.0)
Hemoglobin: 14.7 g/dL (ref 13.0–17.0)
MCH: 30.9 pg (ref 26.0–34.0)
MCHC: 33.9 g/dL (ref 30.0–36.0)
MCV: 91 fL (ref 80.0–100.0)
Platelets: 198 10*3/uL (ref 150–400)
RBC: 4.76 MIL/uL (ref 4.22–5.81)
RDW: 12.7 % (ref 11.5–15.5)
WBC: 8.2 10*3/uL (ref 4.0–10.5)
nRBC: 0 % (ref 0.0–0.2)

## 2023-09-19 LAB — BASIC METABOLIC PANEL
Anion gap: 10 (ref 5–15)
BUN: 15 mg/dL (ref 8–23)
CO2: 22 mmol/L (ref 22–32)
Calcium: 9.1 mg/dL (ref 8.9–10.3)
Chloride: 105 mmol/L (ref 98–111)
Creatinine, Ser: 1.01 mg/dL (ref 0.61–1.24)
GFR, Estimated: >60 mL/min (ref >60)
Glucose, Bld: 91 mg/dL (ref 70–99)
Potassium: 3.9 mmol/L (ref 3.5–5.1)
Sodium: 137 mmol/L (ref 135–145)

## 2023-09-19 LAB — SURGICAL PCR SCREEN
MRSA, PCR: NEGATIVE
Staphylococcus aureus: NEGATIVE

## 2023-09-19 SURGERY — ARTHROPLASTY, KNEE, TOTAL
Anesthesia: Regional | Site: Knee | Laterality: Left

## 2023-09-19 MED ORDER — MIDAZOLAM HCL 2 MG/2ML IJ SOLN
INTRAMUSCULAR | Status: DC | PRN
  Administered 2023-09-19: 2 mg via INTRAVENOUS

## 2023-09-19 MED ORDER — CLONIDINE HCL (ANALGESIA) 100 MCG/ML EP SOLN
EPIDURAL | Status: AC
  Filled 2023-09-19: qty 10

## 2023-09-19 MED ORDER — IRRISEPT - 450ML BOTTLE WITH 0.05% CHG IN STERILE WATER, USP 99.95% OPTIME
TOPICAL | Status: DC | PRN
  Administered 2023-09-19: 450 mL via TOPICAL

## 2023-09-19 MED ORDER — VANCOMYCIN HCL 1000 MG IV SOLR
INTRAVENOUS | Status: AC
  Filled 2023-09-19: qty 20

## 2023-09-19 MED ORDER — ACETAMINOPHEN 500 MG PO TABS
1000.0000 mg | ORAL_TABLET | Freq: Once | ORAL | Status: AC
Start: 2023-09-19 — End: 2023-09-19

## 2023-09-19 MED ORDER — PHENYLEPHRINE HCL-NACL 20-0.9 MG/250ML-% IV SOLN
INTRAVENOUS | Status: DC | PRN
  Administered 2023-09-19: 40 ug/min via INTRAVENOUS

## 2023-09-19 MED ORDER — TRANEXAMIC ACID-NACL 1000-0.7 MG/100ML-% IV SOLN
1000.0000 mg | INTRAVENOUS | Status: AC
  Administered 2023-09-19: 1000 mg via INTRAVENOUS

## 2023-09-19 MED ORDER — ORAL CARE MOUTH RINSE: 15.0000 mL | Freq: Once | OROMUCOSAL | Status: AC

## 2023-09-19 MED ORDER — DOCUSATE SODIUM 100 MG PO CAPS
100.0000 mg | ORAL_CAPSULE | Freq: Two times a day (BID) | ORAL | Status: DC
  Administered 2023-09-19 – 2023-09-20 (×3): 100 mg via ORAL
  Filled 2023-09-19 (×3): qty 1

## 2023-09-19 MED ORDER — PROPOFOL 500 MG/50ML IV EMUL
INTRAVENOUS | Status: DC | PRN
  Administered 2023-09-19: 140 ug/kg/min via INTRAVENOUS
  Administered 2023-09-19: 100 ug/kg/min via INTRAVENOUS

## 2023-09-19 MED ORDER — METOCLOPRAMIDE HCL 5 MG PO TABS: 5.0000 mg | ORAL_TABLET | Freq: Three times a day (TID) | ORAL | Status: DC | PRN

## 2023-09-19 MED ORDER — CHLORHEXIDINE GLUCONATE 0.12 % MT SOLN: 15.0000 mL | Freq: Once | OROMUCOSAL | Status: AC

## 2023-09-19 MED ORDER — DEXAMETHASONE SODIUM PHOSPHATE 10 MG/ML IJ SOLN
INTRAMUSCULAR | Status: DC | PRN
  Administered 2023-09-19: 10 mg

## 2023-09-19 MED ORDER — ACETAMINOPHEN 325 MG PO TABS: 325.0000 mg | ORAL_TABLET | Freq: Four times a day (QID) | ORAL | Status: DC | PRN

## 2023-09-19 MED ORDER — CEFAZOLIN SODIUM-DEXTROSE 2-4 GM/100ML-% IV SOLN
INTRAVENOUS | Status: AC
  Filled 2023-09-19: qty 100

## 2023-09-19 MED ORDER — CEFAZOLIN SODIUM-DEXTROSE 2-4 GM/100ML-% IV SOLN
2.0000 g | INTRAVENOUS | Status: AC
  Administered 2023-09-19: 2 g via INTRAVENOUS

## 2023-09-19 MED ORDER — PHENYLEPHRINE 80 MCG/ML (10ML) SYRINGE FOR IV PUSH (FOR BLOOD PRESSURE SUPPORT)
PREFILLED_SYRINGE | INTRAVENOUS | Status: DC | PRN
  Administered 2023-09-19 (×2): 80 ug via INTRAVENOUS
  Administered 2023-09-19: 160 ug via INTRAVENOUS

## 2023-09-19 MED ORDER — MORPHINE SULFATE (PF) 4 MG/ML IV SOLN
INTRAVENOUS | Status: AC
  Filled 2023-09-19: qty 2

## 2023-09-19 MED ORDER — PHENOL 1.4 % MT LIQD: 1.0000 | OROMUCOSAL | Status: DC | PRN

## 2023-09-19 MED ORDER — DEXAMETHASONE SODIUM PHOSPHATE 10 MG/ML IJ SOLN
INTRAMUSCULAR | Status: DC | PRN
  Administered 2023-09-19: 10 mg via INTRAVENOUS

## 2023-09-19 MED ORDER — POVIDONE-IODINE 7.5 % EX SOLN
Freq: Once | CUTANEOUS | Status: DC
  Filled 2023-09-19: qty 118

## 2023-09-19 MED ORDER — HYDROMORPHONE HCL 1 MG/ML IJ SOLN: 0.5000 mg | INTRAMUSCULAR | Status: DC | PRN

## 2023-09-19 MED ORDER — ONDANSETRON HCL 4 MG PO TABS: 4.0000 mg | ORAL_TABLET | Freq: Four times a day (QID) | ORAL | Status: DC | PRN

## 2023-09-19 MED ORDER — PROPOFOL 10 MG/ML IV BOLUS
INTRAVENOUS | Status: AC
  Filled 2023-09-19: qty 20

## 2023-09-19 MED ORDER — LACTATED RINGERS IV SOLN
INTRAVENOUS | Status: DC
Start: 2023-09-19 — End: 2023-09-19

## 2023-09-19 MED ORDER — TRANEXAMIC ACID 1000 MG/10ML IV SOLN
2000.0000 mg | INTRAVENOUS | Status: DC
  Filled 2023-09-19: qty 20

## 2023-09-19 MED ORDER — CELECOXIB 100 MG PO CAPS
100.0000 mg | ORAL_CAPSULE | Freq: Two times a day (BID) | ORAL | Status: DC
  Administered 2023-09-19 – 2023-09-20 (×3): 100 mg via ORAL
  Filled 2023-09-19 (×3): qty 1

## 2023-09-19 MED ORDER — ACETAMINOPHEN 500 MG PO TABS
ORAL_TABLET | ORAL | Status: AC
  Administered 2023-09-19: 1000 mg via ORAL
  Filled 2023-09-19: qty 2

## 2023-09-19 MED ORDER — ROPIVACAINE HCL 5 MG/ML IJ SOLN
INTRAMUSCULAR | Status: DC | PRN
  Administered 2023-09-19: 20 mL via PERINEURAL

## 2023-09-19 MED ORDER — VANCOMYCIN HCL 1000 MG IV SOLR
INTRAVENOUS | Status: DC | PRN
  Administered 2023-09-19: 1000 mg via TOPICAL

## 2023-09-19 MED ORDER — MIDAZOLAM HCL 2 MG/2ML IJ SOLN
INTRAMUSCULAR | Status: AC
  Filled 2023-09-19: qty 2

## 2023-09-19 MED ORDER — TRANEXAMIC ACID-NACL 1000-0.7 MG/100ML-% IV SOLN
INTRAVENOUS | Status: AC
  Filled 2023-09-19: qty 100

## 2023-09-19 MED ORDER — PROPOFOL 10 MG/ML IV BOLUS
INTRAVENOUS | Status: DC | PRN
  Administered 2023-09-19 (×4): 50 mg via INTRAVENOUS

## 2023-09-19 MED ORDER — DEXAMETHASONE SODIUM PHOSPHATE 10 MG/ML IJ SOLN
INTRAMUSCULAR | Status: AC
  Filled 2023-09-19: qty 1

## 2023-09-19 MED ORDER — FENTANYL CITRATE (PF) 100 MCG/2ML IJ SOLN: 25.0000 ug | INTRAMUSCULAR | Status: DC | PRN

## 2023-09-19 MED ORDER — ACETAMINOPHEN 500 MG PO TABS
1000.0000 mg | ORAL_TABLET | Freq: Four times a day (QID) | ORAL | Status: AC
  Administered 2023-09-19 (×3): 1000 mg via ORAL
  Filled 2023-09-19 (×4): qty 2

## 2023-09-19 MED ORDER — BUPIVACAINE IN DEXTROSE 0.75-8.25 % IT SOLN
INTRATHECAL | Status: DC | PRN
  Administered 2023-09-19: 2 mL via INTRATHECAL

## 2023-09-19 MED ORDER — FENTANYL CITRATE (PF) 250 MCG/5ML IJ SOLN
INTRAMUSCULAR | Status: AC
  Filled 2023-09-19: qty 5

## 2023-09-19 MED ORDER — ONDANSETRON HCL 4 MG/2ML IJ SOLN
INTRAMUSCULAR | Status: DC | PRN
  Administered 2023-09-19: 4 mg via INTRAVENOUS

## 2023-09-19 MED ORDER — GABAPENTIN 300 MG PO CAPS
600.0000 mg | ORAL_CAPSULE | Freq: Two times a day (BID) | ORAL | Status: DC
  Administered 2023-09-19 – 2023-09-20 (×3): 600 mg via ORAL
  Filled 2023-09-19 (×3): qty 2

## 2023-09-19 MED ORDER — METHOCARBAMOL 1000 MG/10ML IJ SOLN: 500.0000 mg | Freq: Four times a day (QID) | INTRAMUSCULAR | Status: DC | PRN

## 2023-09-19 MED ORDER — ASPIRIN 81 MG PO CHEW
81.0000 mg | CHEWABLE_TABLET | Freq: Two times a day (BID) | ORAL | Status: DC
  Administered 2023-09-19 – 2023-09-20 (×2): 81 mg via ORAL
  Filled 2023-09-19 (×2): qty 1

## 2023-09-19 MED ORDER — SODIUM CHLORIDE 0.9 % IR SOLN
Status: DC | PRN
  Administered 2023-09-19: 3000 mL

## 2023-09-19 MED ORDER — SODIUM CHLORIDE (PF) 0.9 % IJ SOLN: INTRAMUSCULAR | Status: DC | PRN

## 2023-09-19 MED ORDER — POVIDONE-IODINE 10 % EX SWAB
2.0000 | Freq: Once | CUTANEOUS | Status: AC
  Administered 2023-09-19: 2 via TOPICAL

## 2023-09-19 MED ORDER — BUPIVACAINE HCL (PF) 0.25 % IJ SOLN
INTRAMUSCULAR | Status: AC
  Filled 2023-09-19: qty 30

## 2023-09-19 MED ORDER — SODIUM CHLORIDE (PF) 0.9 % IJ SOLN
INTRAMUSCULAR | Status: DC | PRN
  Administered 2023-09-19: 63 mL via SURGICAL_CAVITY

## 2023-09-19 MED ORDER — PROPOFOL 1000 MG/100ML IV EMUL
INTRAVENOUS | Status: AC
  Filled 2023-09-19: qty 100

## 2023-09-19 MED ORDER — POVIDONE-IODINE 10 % EX SWAB: 2.0000 | Freq: Once | CUTANEOUS | Status: DC

## 2023-09-19 MED ORDER — ALBUMIN HUMAN 5 % IV SOLN: INTRAVENOUS | Status: DC | PRN

## 2023-09-19 MED ORDER — 0.9 % SODIUM CHLORIDE (POUR BTL) OPTIME
TOPICAL | Status: DC | PRN
  Administered 2023-09-19: 1000 mL

## 2023-09-19 MED ORDER — BUPIVACAINE LIPOSOME 1.3 % IJ SUSP
INTRAMUSCULAR | Status: AC
  Filled 2023-09-19: qty 20

## 2023-09-19 MED ORDER — CHLORHEXIDINE GLUCONATE 0.12 % MT SOLN
OROMUCOSAL | Status: AC
  Administered 2023-09-19: 15 mL via OROMUCOSAL
  Filled 2023-09-19: qty 15

## 2023-09-19 MED ORDER — CEFAZOLIN SODIUM-DEXTROSE 2-4 GM/100ML-% IV SOLN
2.0000 g | Freq: Three times a day (TID) | INTRAVENOUS | Status: AC
  Administered 2023-09-19 (×2): 2 g via INTRAVENOUS
  Filled 2023-09-19 (×2): qty 100

## 2023-09-19 MED ORDER — MENTHOL 3 MG MT LOZG: 1.0000 | LOZENGE | OROMUCOSAL | Status: DC | PRN

## 2023-09-19 MED ORDER — METOCLOPRAMIDE HCL 5 MG/ML IJ SOLN: 5.0000 mg | Freq: Three times a day (TID) | INTRAMUSCULAR | Status: DC | PRN

## 2023-09-19 MED ORDER — FENTANYL CITRATE (PF) 250 MCG/5ML IJ SOLN
INTRAMUSCULAR | Status: DC | PRN
  Administered 2023-09-19: 50 ug via INTRAVENOUS

## 2023-09-19 MED ORDER — OXYCODONE HCL 5 MG PO TABS
5.0000 mg | ORAL_TABLET | ORAL | Status: DC | PRN
Start: 2023-09-19 — End: 2023-09-20
  Administered 2023-09-19 – 2023-09-20 (×2): 5 mg via ORAL
  Filled 2023-09-19 (×2): qty 1

## 2023-09-19 MED ORDER — METHOCARBAMOL 500 MG PO TABS: 500.0000 mg | ORAL_TABLET | Freq: Four times a day (QID) | ORAL | Status: DC | PRN

## 2023-09-19 MED ORDER — ONDANSETRON HCL 4 MG/2ML IJ SOLN
INTRAMUSCULAR | Status: AC
  Filled 2023-09-19: qty 2

## 2023-09-19 MED ORDER — TRANEXAMIC ACID 1000 MG/10ML IV SOLN
INTRAVENOUS | Status: DC | PRN
  Administered 2023-09-19: 2000 mg via TOPICAL

## 2023-09-19 SURGICAL SUPPLY — 72 items
BAG COUNTER SPONGE SURGICOUNT (BAG) ×1 IMPLANT
BAG DECANTER FOR FLEXI CONT (MISCELLANEOUS) ×1 IMPLANT
BANDAGE ESMARK 6X9 LF (GAUZE/BANDAGES/DRESSINGS) ×1 IMPLANT
BLADE SAG 18X100X1.27 (BLADE) ×1 IMPLANT
BLADE SAW SGTL 13X75X1.27 (BLADE) ×1 IMPLANT
BLADE SAW THK.89X75X18XSGTL (BLADE) ×1 IMPLANT
BNDG COHESIVE 6X5 TAN ST LF (GAUZE/BANDAGES/DRESSINGS) ×1 IMPLANT
BNDG ELASTIC 6X15 VLCR STRL LF (GAUZE/BANDAGES/DRESSINGS) ×1 IMPLANT
BNDG ESMARK 6X9 LF (GAUZE/BANDAGES/DRESSINGS) ×1
BOWL SMART MIX CTS (DISPOSABLE) IMPLANT
CLSR STERI-STRIP ANTIMIC 1/2X4 (GAUZE/BANDAGES/DRESSINGS) IMPLANT
CNTNR URN SCR LID CUP LEK RST (MISCELLANEOUS) ×1 IMPLANT
COMP FEM SZ5 CRUC LEFT RETAIN (Orthopedic Implant) ×1 IMPLANT
COMPONENT FEM SZ5 CRU LT RETN (Orthopedic Implant) IMPLANT
COOLER ICEMAN CLASSIC (MISCELLANEOUS) IMPLANT
COVER SURGICAL LIGHT HANDLE (MISCELLANEOUS) ×1 IMPLANT
CUFF TOURN SGL QUICK 42 (TOURNIQUET CUFF) IMPLANT
CUFF TRNQT CYL 34X4.125X (TOURNIQUET CUFF) ×1 IMPLANT
DRAPE INCISE IOBAN 66X45 STRL (DRAPES) IMPLANT
DRAPE SURG ORHT 6 SPLT 77X108 (DRAPES) ×3 IMPLANT
DRAPE U-SHAPE 47X51 STRL (DRAPES) ×1 IMPLANT
DRSG AQUACEL AG ADV 3.5X14 (GAUZE/BANDAGES/DRESSINGS) IMPLANT
DURAPREP 26ML APPLICATOR (WOUND CARE) ×2 IMPLANT
ELECT CAUTERY BLADE 6.4 (BLADE) ×1 IMPLANT
ELECT REM PT RETURN 9FT ADLT (ELECTROSURGICAL) ×1
ELECTRODE REM PT RTRN 9FT ADLT (ELECTROSURGICAL) ×1 IMPLANT
GAUZE SPONGE 4X4 12PLY STRL (GAUZE/BANDAGES/DRESSINGS) ×1 IMPLANT
GLOVE BIOGEL PI IND STRL 7.0 (GLOVE) ×1 IMPLANT
GLOVE BIOGEL PI IND STRL 8 (GLOVE) ×1 IMPLANT
GLOVE ECLIPSE 7.0 STRL STRAW (GLOVE) ×1 IMPLANT
GLOVE ECLIPSE 8.0 STRL XLNG CF (GLOVE) ×1 IMPLANT
GLOVE SURG ENC MOIS LTX SZ6.5 (GLOVE) ×3 IMPLANT
GOWN STRL REUS W/ TWL LRG LVL3 (GOWN DISPOSABLE) ×3 IMPLANT
HOOD PEEL AWAY T7 (MISCELLANEOUS) ×3 IMPLANT
IMMOBILIZER KNEE 20 (SOFTGOODS)
IMMOBILIZER KNEE 20 THIGH 36 (SOFTGOODS) IMPLANT
IMMOBILIZER KNEE 22 UNIV (SOFTGOODS) IMPLANT
IMMOBILIZER KNEE 24 THIGH 36 (MISCELLANEOUS) IMPLANT
IMMOBILIZER KNEE 24 UNIV (MISCELLANEOUS)
INSERT TRIATH CS SZ6 10 (Insert) IMPLANT
KIT BASIN OR (CUSTOM PROCEDURE TRAY) ×1 IMPLANT
KIT TURNOVER KIT B (KITS) ×1 IMPLANT
KNEE TIBIAL COMPONENT SZ6 (Knees) IMPLANT
MANIFOLD NEPTUNE II (INSTRUMENTS) ×1 IMPLANT
NDL 22X1.5 STRL (OR ONLY) (MISCELLANEOUS) ×2 IMPLANT
NDL SPNL 18GX3.5 QUINCKE PK (NEEDLE) ×1 IMPLANT
NEEDLE 22X1.5 STRL (OR ONLY) (MISCELLANEOUS) ×2 IMPLANT
NEEDLE SPNL 18GX3.5 QUINCKE PK (NEEDLE) ×1 IMPLANT
NS IRRIG 1000ML POUR BTL (IV SOLUTION) ×2 IMPLANT
PACK TOTAL JOINT (CUSTOM PROCEDURE TRAY) ×1 IMPLANT
PAD ARMBOARD 7.5X6 YLW CONV (MISCELLANEOUS) ×2 IMPLANT
PAD CAST 4YDX4 CTTN HI CHSV (CAST SUPPLIES) ×1 IMPLANT
PAD ORTHO SHOULDER 7X19 LRG (SOFTGOODS) IMPLANT
PADDING CAST COTTON 6X4 STRL (CAST SUPPLIES) ×1 IMPLANT
PATELLA ASYMMETRIC 38X11 KNEE (Orthopedic Implant) IMPLANT
PIN FLUTED HDLS LG 1/8X35 DISP (PIN) IMPLANT
SET HNDPC FAN SPRY TIP SCT (DISPOSABLE) ×1 IMPLANT
SPIKE FLUID TRANSFER (MISCELLANEOUS) ×1 IMPLANT
STRIP CLOSURE SKIN 1/2X4 (GAUZE/BANDAGES/DRESSINGS) ×2 IMPLANT
SUCTION TUBE FRAZIER 10FR DISP (SUCTIONS) ×1 IMPLANT
SUT MNCRL AB 3-0 PS2 18 (SUTURE) ×1 IMPLANT
SUT VIC AB 0 CT1 27XBRD ANBCTR (SUTURE) ×3 IMPLANT
SUT VIC AB 1 CT1 36 (SUTURE) ×5 IMPLANT
SUT VIC AB 2-0 CT1 TAPERPNT 27 (SUTURE) ×4 IMPLANT
SUT VICRYL 0 27 CT2 27 ABS (SUTURE) ×1 IMPLANT
SYR 30ML LL (SYRINGE) ×3 IMPLANT
SYR TB 1ML LUER SLIP (SYRINGE) ×1 IMPLANT
TOWEL GREEN STERILE (TOWEL DISPOSABLE) ×2 IMPLANT
TOWEL GREEN STERILE FF (TOWEL DISPOSABLE) ×2 IMPLANT
TRAY CATH INTERMITTENT SS 16FR (CATHETERS) IMPLANT
WATER STERILE IRR 1000ML POUR (IV SOLUTION) IMPLANT
YANKAUER SUCT BULB TIP NO VENT (SUCTIONS) ×1 IMPLANT

## 2023-09-19 NOTE — Anesthesia Procedure Notes (Signed)
 Date/Time: 09/19/2023 7:33 AM  Performed by: Jolynn Mage, CRNAPre-anesthesia Checklist: Patient identified, Emergency Drugs available, Suction available, Timeout performed and Patient being monitored Patient Re-evaluated:Patient Re-evaluated prior to induction Oxygen Delivery Method: Simple face mask

## 2023-09-19 NOTE — TOC Initial Note (Addendum)
 Transition of Care [facility]) - Initial/Assessment Note    Patient Details  Name: ANEUDY CHAMPLAIN MRN: [id number] Date of Birth: 1956-02-18  Transition of Care [facility]) CM/SW Contact:    Rosalva Jon Bloch, RN Phone Number: 09/19/2023, 4:24 PM  Clinical Narrative:                 -S/p Left total knee replacement 1/9   From home with wife. PTA independent with ADL's, no DME usage.  Order noted for home health PT services. Pt agreeable. No provider prefernce. Referral made with [facility] and accepted. Referral made with [facility] for RW. Equipment will be delivered to bedside prior to d/c. [place] with [employer] [phone]) will deliver CPM to pt's home once d/c. Pt without transportation issues or RX MED concerns. TOC TEAM following and will assist with needs  1/10 per MD 8:00 am : No home health PT for this patient. He wants to do physical therapy on his own which I think should be fine based on his results with his right knee. [facility] made aware.  Patient Goals and CMS Choice            Expected Discharge Plan and Services                                              Prior Living Arrangements/Services                       Activities of Daily Living   ADL Screening (condition at time of admission) Independently performs ADLs?: Yes (appropriate for developmental age) Is the patient deaf or have difficulty hearing?: No Does the patient have difficulty seeing, even when wearing glasses/contacts?: No Does the patient have difficulty concentrating, remembering, or making decisions?: No  Permission Sought/Granted                  Emotional Assessment              Admission diagnosis:  OA (osteoarthritis) of knee [M17.9] S/P total knee replacement, left [Z96.652] Patient Active Problem List   Diagnosis Date Noted   OA (osteoarthritis) of knee 09/19/2023   S/P total knee replacement, left 09/19/2023   Arthritis of knee 04/01/2018    Unilateral primary osteoarthritis, right knee    Prostatic hyperplasia 02/28/2018   PCP:  Lari Elspeth BRAVO, MD Pharmacy:   [facility] - [place], [place] - [address] [place] [address] Phone: [phone] Fax: [phone]  [facility] [address] [place] [address] Phone: [phone] Fax: [phone]     Social Drivers of Health (SDOH) Social History: SDOH Screenings   Social Connections: Unknown (01/21/2022)   Received from [facility]  Tobacco Use: Low Risk  (09/19/2023)   SDOH Interventions:     Readmission Risk Interventions     No data to display

## 2023-09-19 NOTE — Op Note (Signed)
 NAME: NICKLAS, MCSWEENEY MEDICAL RECORD NO: [id number] ACCOUNT NO: [id number] DATE OF BIRTH: 1956/05/02 FACILITY: [facility] LOCATION: [facility] PHYSICIAN: Cordella RAMAN. Addie, MD  Operative Report   DATE OF PROCEDURE: 09/19/2023  PREOPERATIVE DIAGNOSIS:  Left knee arthritis.  POSTOPERATIVE DIAGNOSIS:  Left knee arthritis.  PROCEDURE:  Left total knee replacement using Stryker Triathlon cruciate retaining size 5 femur, size 6 press fit tibia, size 38 asymmetric patella with 10 mm deep dish polyethylene liner.  SURGEON:  Cordella RAMAN. Addie, MD  ASSISTANT:  Herlene Calix, PA  INDICATIONS:  This is a 68 year old male who did well with previous right total knee replacement 5 years ago and is having consistent left knee pain and presents for operative management after explanation of the risks and benefits.  DESCRIPTION OF PROCEDURE:  The patient was brought to the operating room where spinal anesthetic was induced.  Preoperative antibiotics were administered.  Timeout was called.  Left leg was prescrubbed with alcohol and Betadine .  Allowed to air dry,  prepped with DuraPrep solution and draped in a sterile manner.  He had about a 5-degree flexion contracture, but did have excellent flexion and mild varus deformity.  After sterile prepping and draping, Ioban was used to cover the operative field.   Timeout was called.  The leg was elevated and exsanguinated with Esmarch wrap.  Tourniquet was inflated.  Anterior approach to the knee was made.  Skin and subcutaneous tissues were sharply divided.  IrriSept solution was utilized at this time.  Median  parapatellar arthrotomy was made marked with #1 Vicryl suture.  IrriSept solution was again used for the arthrotomy portion of the case as well.  Fat pad was partially excised.  Medial soft tissue dissection was performed proportionate to the patient's  mild to moderate preop varus deformity.  Anterior horn lateral meniscus released, ACL released, lateral  patellofemoral ligament released, soft tissue removed from the anterior distal femur.  Patella everted and the knee flexed.  Osteophytes were removed.   Intramedullary alignment was then used to make a cut on the tibia, which was 2 mm off the most affected medial tibial plateau.  This was done with the collaterals and posterior neurovascular structures protected.  Bone quality was excellent.  The femur  was then cut in 5 degrees of valgus with a 10-mm cut.  A 9 mm spacer fit well within that space and the patient did achieve full extension.  Femur was sized to a size 5 and it was cut in 3 degrees of external rotation, which gave symmetric flexion and  extension gaps.  Anterior, posterior, and chamfer cuts were made.  No notching occurred.  At this time, the tibial tray was placed and it was a size 6 aligned with the medial third of the tibial tubercle.  With trial components in position, the patient  had most stability in varus and valgus stress at 0, 30, and 90 degrees with a 10 mm spacer.  Femur and tibia placed along with the patella, which was cut down from 23 to 12 mm and an 11-mm patellar button was placed.  With trial components in position,  and that included the 10 mm spacer, the patient achieved about 1 degree of hyperextension and full flexion with good stability to varus and valgus stress at 0, 30, and 90 degrees and the patella tracked well using no thumb technique.  Final preparation  was made on both the femur and the tibia with keel punch.  Trial components were removed.  Thorough  irrigation was performed with 3 liters of irrigating solution.  TXA sponge along with IrriSept allowed to sit for 3 minutes within the incision itself.   These were removed.  IrriSept solution was then placed in the tibial canal and it was sucked out and we placed the vancomycin  powder in the tibial canal and on the cut bony surface, which had been prepared on the medial side using feathering of a drill  pin.  The  tibia was press fit into good position and alignment onto the very flat surface of the tibial plateau.  In a similar manner, the femur was seated and we placed a 10-mm spacer.  Patella was placed with very good press fit obtained.  The final  construct had the same stability and range of motion parameters with about 1 degree of hyperextension, full flexion with excellent patellar tracking using no thumb technique and very good stability to varus and valgus stress at 0, 30, and 90 degrees.  At  this time, the tourniquet was released.  Bleeding points were encountered controlled with electrocautery.  Pouring irrigation x3 liters utilized.  It should also be noted that we anesthetized the capsule just before placing the TXA sponge.  We did that  with Marcaine , saline, and Exparel .  At this time, the irrigation was performed and the arthrotomy was closed over a bolster using #1 Vicryl suture.  Prior to final arthrotomy closure, we did irrigate out the knee joint one more time with IrriSept  solution and then placed vancomycin  powder inside the joint and that arthrotomy was closed completely.  We then placed Marcaine , morphine , and clonidine  into the knee joint for postop pain relief.  Further closure was performed after irrigation using 0  Vicryl suture, 2-0 Vicryl suture, and 3-0 Monocryl with Steri-Strips, Aquacel dressing, Ace wrap, and knee immobilizer placed.  The patient had very nice extension at the conclusion of the case and very good alignment.  Knee immobilizer placed and the  patient was transferred to the recovery room in stable condition.  Luke's assistance was required at all times for retraction, opening, closing, mobilization of tissue.  His assistance was of medical necessity.   PUS D: 09/19/2023 10:45:42 am T: 09/19/2023 11:20:00 am  JOB: [id number]

## 2023-09-19 NOTE — Transfer of Care (Signed)
 Immediate Anesthesia Transfer of Care Note  Patient: NAIM MURTHA  Procedure(s) Performed: LEFT TOTAL KNEE ARTHROPLASTY (Left: Knee)  Patient Location: PACU  Anesthesia Type:Spinal  Level of Consciousness: drowsy  Airway & Oxygen Therapy: Patient Spontanous Breathing and Patient connected to face mask oxygen  Post-op Assessment: Report given to RN and Post -op Vital signs reviewed and stable  Post vital signs: Reviewed and stable  Last Vitals:  Vitals Value Taken Time  BP 108/71 09/19/23 1040  Temp 36.1 C 09/19/23 1033  Pulse 61 09/19/23 1044  Resp 11 09/19/23 1044  SpO2 98 % 09/19/23 1044  Vitals shown include unfiled device data.  Last Pain:  Vitals:   09/19/23 1033  PainSc: Asleep         Complications: No notable events documented.

## 2023-09-19 NOTE — Progress Notes (Signed)
 Orthopedic Tech Progress Note Patient Details:  CALUB TARNOW 11-May-1956 [id number]  CPM Left Knee CPM Left Knee: On Left Knee Flexion (Degrees): 40 Left Knee Extension (Degrees): 10  Post Interventions Patient Tolerated: Well Instructions Provided: Care of device, Adjustment of device Ortho Devices Type of Ortho Device: Bone foam zero knee, CPM padding Ortho Device/Splint Location: Bone foam at bedside for use when not using the CPM. Ortho Device/Splint Interventions: Ordered, Application, Adjustment   Post Interventions Patient Tolerated: Well Instructions Provided: Care of device, Adjustment of device  Bexley Mclester Ronal Brasil 09/19/2023, 4:24 PM

## 2023-09-19 NOTE — Progress Notes (Signed)
 IceMan applied.

## 2023-09-19 NOTE — Anesthesia Procedure Notes (Addendum)
 Anesthesia Regional Block: Adductor canal block   Pre-Anesthetic Checklist: , timeout performed,  Correct Patient, Correct Site, Correct Laterality,  Correct Procedure, Correct Position, site marked,  Risks and benefits discussed,  Pre-op evaluation,  At surgeon's request and post-op pain management  Laterality: Left  Prep: Maximum Sterile Barrier Precautions used, chloraprep       Needles:  Injection technique: Single-shot  Needle Type: Echogenic Stimulator Needle     Needle Length: 9cm  Needle Gauge: 21     Additional Needles:   Procedures:,,,, ultrasound used (permanent image in chart),,    Narrative:  Start time: 09/19/2023 7:05 AM End time: 09/19/2023 7:07 AM Injection made incrementally with aspirations every 5 mL. Anesthesiologist: Niels Marien CROME, MD

## 2023-09-19 NOTE — Progress Notes (Signed)
 Home medication found in patient's belongings.  Gabapentin 600mg / 93 count Simvastatin 10 mg/ 50 count Ambien 10 mg/ 21 whole count and 9 pieces   Receipt in patient's chart, medications taken to Pharmacy per protocol/policy.

## 2023-09-19 NOTE — Evaluation (Signed)
 Physical Therapy Evaluation Patient Details Name: Christopher Ross MRN: [id number] DOB: 06-28-1956 Today's Date: 09/19/2023  History of Present Illness  Pt is a 68 y.o. male who presented 09/19/23 for elective L TKA. PMH: ADHD, anxiety, HLD, OA of knee  Clinical Impression  Pt presents with condition above and deficits mentioned below, see PT Problem List. PTA, he was independent without DME, working in audiological scientist estate, and living with his wife in a 2-level house with 1 STE. Currently, pt is demonstrating expected deficits in L knee strength and ROM s/p TKA. He is also demonstrating deficits in safety awareness as he can be a bit impulsive at times. Pt did admit that he thinks he is still under the effects of anesthesia though. As a result of pain and some of the deficits above, he is also demonstrating deficits in balance along with gait deviations. He is currently requiring CGA for transfers and CGA-minA to ambulate with a RW with his L KI donned. He will likely progress well as he mobilizes more frequently. Will continue to follow acutely to maximize his return to baseline prior to d/c home. Follow physician's recommendations for discharge plan and follow up therapies.      If plan is discharge home, recommend the following: A little help with walking and/or transfers;A little help with bathing/dressing/bathroom;Assistance with cooking/housework;Assist for transportation;Help with stairs or ramp for entrance   Can travel by private vehicle        Equipment Recommendations Rolling walker (2 wheels)  Recommendations for Other Services       Functional Status Assessment Patient has had a recent decline in their functional status and demonstrates the ability to make significant improvements in function in a reasonable and predictable amount of time.     Precautions / Restrictions Precautions Precautions: Fall Required Braces or Orthoses: Knee Immobilizer - Left (initially) Restrictions Weight  Bearing Restrictions Per Provider Order: Yes LLE Weight Bearing Per Provider Order: Weight bearing as tolerated      Mobility  Bed Mobility Overal bed mobility: Needs Assistance Bed Mobility: Supine to Sit, Sit to Supine     Supine to sit: Supervision, HOB elevated Sit to supine: Supervision, HOB elevated   General bed mobility comments: Supervision for safety, HOB elevated    Transfers Overall transfer level: Needs assistance Equipment used: Rolling walker (2 wheels) Transfers: Sit to/from Stand Sit to Stand: Contact guard assist           General transfer comment: Pt stood 2x from EOB. Cues provided for hand placement. No LOB, CGA for safety    Ambulation/Gait Ambulation/Gait assistance: Contact guard assist, Min assist Gait Distance (Feet): 240 Feet Assistive device: Rolling walker (2 wheels) Gait Pattern/deviations: Step-through pattern, Decreased step length - right, Decreased stance time - left, Decreased stride length, Antalgic, Knees buckling Gait velocity: reduced Gait velocity interpretation: <1.8 ft/sec, indicate of risk for recurrent falls   General Gait Details: Pt had L knee immobilizer donned during gait this date due to noted knee buckling. VCs provided to extend L knee during stance phase. Pt successful when concentrating. Intermittent light minA provided to ensure pt's safety when his L knee would partially buckle, even while in [place]. Otherwise, CGA for safety  Stairs            Wheelchair Mobility     Tilt Bed    Modified Rankin (Stroke Patients Only)       Balance Overall balance assessment: Needs assistance Sitting-balance support: No upper extremity supported, Feet supported  Sitting balance-Leahy Scale: Good     Standing balance support: Bilateral upper extremity supported, During functional activity, Reliant on assistive device for balance Standing balance-Leahy Scale: Poor Standing balance comment: Reliant on RW                              Pertinent Vitals/Pain Pain Assessment Pain Assessment: Faces Faces Pain Scale: Hurts little more Pain Location: L knee Pain Descriptors / Indicators: Discomfort, Grimacing, Operative site guarding Pain Intervention(s): Limited activity within patient's tolerance, Monitored during session, Repositioned    Home Living Family/patient expects to be discharged to:: Private residence Living Arrangements: Spouse/significant other Available Help at Discharge: Family;Available 24 hours/day Type of Home: House Home Access: Stairs to enter Entrance Stairs-Rails: None Entrance Stairs-Number of Steps: 1   Home Layout: Two level;Able to live on main level with bedroom/bathroom Home Equipment: None      Prior Function Prior Level of Function : Independent/Modified Independent;Driving;Working/employed             Mobility Comments: No AD ADLs Comments: Works in research officer, political party     Extremity/Trunk Assessment   Upper Extremity Assessment Upper Extremity Assessment: Overall WFL for tasks assessed    Lower Extremity Assessment Lower Extremity Assessment: LLE deficits/detail LLE Deficits / Details: per visual estimation, pt lacking ~5' knee extension AAROM, achieving ~100' knee flexion AAROM while supine; denied numbness/tingling; quads weakness noted with functional mobility at this time    Cervical / Trunk Assessment Cervical / Trunk Assessment: Normal  Communication   Communication Communication: No apparent difficulties  Cognition Arousal: Alert Behavior During Therapy: Impulsive Overall Cognitive Status: No family/caregiver present to determine baseline cognitive functioning                                 General Comments: Pt can be a bit impulsive with decreased safety awareness at times, needing cues to wait for therapist to move RW out of his way before transitioning supine to sit etc. Pt admitted he feels like he is still under the effects  of anesthesia currently        General Comments General comments (skin integrity, edema, etc.): provided pt with TKA HEP handout, educated pt to keep knee straight at rest and never place a pillow under the knee itself    Exercises     Assessment/Plan    PT Assessment Patient needs continued PT services  PT Problem List Decreased strength;Decreased range of motion;Decreased activity tolerance;Decreased balance;Decreased mobility;Decreased cognition;Decreased safety awareness;Pain       PT Treatment Interventions DME instruction;Stair training;Gait training;Functional mobility training;Therapeutic activities;Therapeutic exercise;Balance training;Cognitive remediation;Neuromuscular re-education;Patient/family education    PT Goals (Current goals can be found in the Care Plan section)  Acute Rehab PT Goals Patient Stated Goal: to get more therapy before going home PT Goal Formulation: With patient Time For Goal Achievement: 09/26/23 Potential to Achieve Goals: Good    Frequency 7X/week     Co-evaluation               AM-PAC PT 6 Clicks Mobility  Outcome Measure Help needed turning from your back to your side while in a flat bed without using bedrails?: A Little Help needed moving from lying on your back to sitting on the side of a flat bed without using bedrails?: A Little Help needed moving to and from a bed to a chair (including a wheelchair)?:  A Little Help needed standing up from a chair using your arms (e.g., wheelchair or bedside chair)?: A Little Help needed to walk in hospital room?: A Little Help needed climbing 3-5 steps with a railing? : A Little 6 Click Score: 18    End of Session Equipment Utilized During Treatment: Gait belt;Left knee immobilizer Activity Tolerance: Patient tolerated treatment well Patient left: in bed;with call bell/phone within reach;with bed alarm set Nurse Communication: Mobility status;Other (comment) (pt impulsive) PT Visit  Diagnosis: Unsteadiness on feet (R26.81);Other abnormalities of gait and mobility (R26.89);Muscle weakness (generalized) (M62.81);Pain Pain - Right/Left: Left Pain - part of body: Knee    Time: 8387-8352 PT Time Calculation (min) (ACUTE ONLY): 35 min   Charges:   PT Evaluation $PT Eval Low Complexity: 1 Low PT Treatments $Gait Training: 8-22 mins PT General Charges $$ ACUTE PT VISIT: 1 Visit         Theo Ferretti, PT, DPT Acute Rehabilitation Services  Office: [phone]   Theo CHRISTELLA Ferretti 09/19/2023, 5:20 PM

## 2023-09-19 NOTE — Progress Notes (Signed)
   09/19/23 1302  Vitals  Temp 97.7 F (36.5 C)  Temp Source Oral  BP 105/77  MAP (mmHg) 85  BP Location Left Arm  BP Method Automatic  Patient Position (if appropriate) Lying  Pulse Rate 62  Pulse Rate Source Monitor  ECG Heart Rate 62  Resp 16  Oxygen Therapy  SpO2 97 %  O2 Device Room Air  ECG Monitoring  Cardiac Rhythm NSR  MEWS Score  MEWS Temp 0  MEWS Systolic 0  MEWS Pulse 0  MEWS RR 0  MEWS LOC 0  MEWS Score 0  MEWS Score Color Green     Patient arrived from PACU, VSS. Denies any pain. YALE passed.

## 2023-09-19 NOTE — Brief Op Note (Signed)
   09/19/2023  10:38 AM  PATIENT:  Christopher Ross  68 y.o. male  PRE-OPERATIVE DIAGNOSIS:  left knee osteoarthritis  POST-OPERATIVE DIAGNOSIS:  left knee osteoarthritis  PROCEDURE:  Procedure(s): LEFT TOTAL KNEE ARTHROPLASTY  SURGEON:  Surgeon(s): Addie Cordella Hamilton, MD  ASSISTANT: magnant pa  ANESTHESIA:   spinal  EBL: 75 ml    Total I/O In: 1300 [I.V.:600; IV Piggyback:700] Out: 225 [Urine:150; Blood:75]  BLOOD ADMINISTERED: none  DRAINS: none   LOCAL MEDICATIONS USED: Marcaine  morphine  clonidine  Exparel  vancomycin  powder  SPECIMEN:  No Specimen  COUNTS:  YES  TOURNIQUET:   Total Tourniquet Time Documented: Thigh (Left) - 82 minutes Total: Thigh (Left) - 82 minutes   DICTATION: .Other Dictation: Dictation Number [id number]  PLAN OF CARE: Admit for overnight observation  PATIENT DISPOSITION:  PACU - hemodynamically stable

## 2023-09-19 NOTE — Plan of Care (Signed)

## 2023-09-19 NOTE — Anesthesia Procedure Notes (Signed)
 Spinal  Patient location during procedure: OR Start time: 09/19/2023 7:45 AM End time: 09/19/2023 7:50 AM Reason for block: surgical anesthesia Staffing Performed: anesthesiologist  Anesthesiologist: Niels Marien CROME, MD Performed by: Niels Marien CROME, MD Authorized by: Niels Marien CROME, MD   Preanesthetic Checklist Completed: patient identified, IV checked, risks and benefits discussed, surgical consent, monitors and equipment checked, pre-op evaluation and timeout performed Spinal Block Patient position: sitting Prep: DuraPrep and site prepped and draped Patient monitoring: cardiac monitor, continuous pulse ox and blood pressure Approach: midline Location: L3-4 Injection technique: single-shot Needle Needle type: Quincke  Needle gauge: 22 G Needle length: 9 cm Assessment Sensory level: T6 Events: CSF return Additional Notes Functioning IV was confirmed and monitors were applied. Sterile prep and drape, including hand hygiene and sterile gloves were used. The patient was positioned and the spine was prepped. The skin was anesthetized with lidocaine .  Free flow of clear CSF was obtained prior to injecting local anesthetic into the CSF.  The spinal needle aspirated freely following injection.  The needle was carefully withdrawn.  The patient tolerated the procedure well.   2 attempts. First attempt at L4/5 with 24G Pencan. Second attempt at L3/4 with 22G Quincke.

## 2023-09-19 NOTE — H&P (Signed)
 TOTAL KNEE ADMISSION H&P  Patient is being admitted for left total knee arthroplasty.  Subjective:  Chief Complaint:left knee pain.  HPI: Christopher Ross, 68 y.o. male, has a history of pain and functional disability in the left knee due to arthritis and has failed non-surgical conservative treatments for greater than 12 weeks to includeNSAID's and/or analgesics, flexibility and strengthening excercises, and activity modification.  Onset of symptoms was gradual, starting 7 years ago with gradually worsening course since that time. The patient noted no past surgery on the left knee(s).  Patient currently rates pain in the left knee(s) at 8 out of 10 with activity. Patient has night pain, worsening of pain with activity and weight bearing, pain that interferes with activities of daily living, pain with passive range of motion, crepitus, and joint swelling.  Patient has evidence of subchondral sclerosis and joint space narrowing by imaging studies. This patient has had  a good result with his right total knee replacement . There is no active infection.No personal or family history of dvt or pe.  Patient Active Problem List   Diagnosis Date Noted   Arthritis of knee 04/01/2018   Unilateral primary osteoarthritis, right knee    Prostatic hyperplasia 02/28/2018   Past Medical History:  Diagnosis Date   ADHD (attention deficit hyperactivity disorder)    no meds, no current problem per pt on 09/18/23   Anxiety    Dog bite 02/27/2018   RIGHT FINGER LEFT HAND SMALL BITE AREA CLEANED WITH ALCOHOL AND BANDAID APPLIED    Enlarged prostate    Hyperlipidemia    Inguinal hernia    OA (osteoarthritis) of knee    Right   Wears glasses     Past Surgical History:  Procedure Laterality Date   ANTERIOR CERVICAL DECOMP/DISCECTOMY FUSION  07/30/2012   Procedure: ANTERIOR CERVICAL DECOMPRESSION/DISCECTOMY FUSION 2 LEVELS;  Surgeon: Reyes JONETTA Budge, MD;  Location: [facility];  Service: Neurosurgery;   Laterality: N/A;  Cervical four-five,Cervical five-six anterior cervical decompression with fusion interbody prothesis plating and bonegraft   COLONOSCOPY  2015   and in 2020 - in [place]   FRACTURE SURGERY  YRS AGO   broken left arm, repair   INGUINAL HERNIA REPAIR Right 09/12/2017   Procedure: LAPAROSCOPIC RIGHT INGUINAL HERNIA REPAIR WITH MESH;  Surgeon: Vernetta Berg, MD;  Location: [facility];  Service: General;  Laterality: Right;   INSERTION OF MESH Right 09/12/2017   Procedure: INSERTION OF MESH;  Surgeon: Vernetta Berg, MD;  Location: [facility];  Service: General;  Laterality: Right;   TOTAL KNEE ARTHROPLASTY Right 04/01/2018   Procedure: RIGHT TOTAL KNEE ARTHROPLASTY;  Surgeon: Addie Cordella Hamilton, MD;  Location: [facility] OR;  Service: Orthopedics;  Laterality: Right;   TRANSURETHRAL RESECTION OF PROSTATE N/A 02/28/2018   Procedure: TRANSURETHRAL RESECTION OF THE PROSTATE (TURP);  Surgeon: Alvaro Hummer, MD;  Location: [facility];  Service: Urology;  Laterality: N/A;    Current Facility-Administered Medications  Medication Dose Route Frequency Provider Last Rate Last Admin   ceFAZolin  (ANCEF ) 2-4 GM/100ML-% IVPB            ceFAZolin  (ANCEF ) IVPB 2g/100 mL premix  2 g Intravenous On Call to OR Magnant, Charles L, PA-C       lactated ringers  infusion   Intravenous Continuous Woodrum, Chelsey L, MD       povidone-iodine  (BETADINE ) 7.5 % scrub   Topical Once Magnant, Charles L, PA-C       povidone-iodine  10 % swab 2 Application  2  Application Topical Once Magnant, Charles L, PA-C       tranexamic acid  (CYKLOKAPRON ) 1000MG /155mL IVPB            tranexamic acid  (CYKLOKAPRON ) 2,000 mg in sodium chloride  0.9 % 50 mL Topical Application  2,000 mg Topical To OR Magnant, Charles L, PA-C       tranexamic acid  (CYKLOKAPRON ) IVPB 1,000 mg  1,000 mg Intravenous To OR Magnant, Charles L, PA-C       No Known Allergies  Social History   Tobacco Use   Smoking status: Never   Smokeless tobacco: Never   Substance Use Topics   Alcohol use: Yes    Alcohol/week: 5.0 - 7.0 standard drinks of alcohol    Types: 5 - 7 Glasses of wine per week    History reviewed. No pertinent family history.   Review of Systems  Musculoskeletal:  Positive for arthralgias.  All other systems reviewed and are negative.   Objective:  Physical Exam Vitals reviewed.  HENT:     Head: Normocephalic.     Nose: Nose normal.     Mouth/Throat:     Mouth: Mucous membranes are moist.  Eyes:     Pupils: Pupils are equal, round, and reactive to light.  Cardiovascular:     Rate and Rhythm: Normal rate.     Pulses: Normal pulses.  Pulmonary:     Effort: Pulmonary effort is normal.  Abdominal:     General: Abdomen is flat.  Musculoskeletal:     Cervical back: Normal range of motion.  Skin:    General: Skin is warm.     Capillary Refill: Capillary refill takes less than 2 seconds.  Neurological:     General: No focal deficit present.     Mental Status: He is alert.  Psychiatric:        Mood and Affect: Mood normal.   Ortho exam demonstrates normal gait alignment. Has excellent quad tone in both legs. Lacks about 7 degrees of full extension on the left and he does have mild varus alignment. Collateral crucial ligaments are stable. Has medial greater than lateral joint line tenderness. No groin pain with internal/external rotation of that left leg. Pedal pulses palpable. Ankle dorsiflexion intact. Patellofemoral crepitus is present. Trace effusion present.  Vital signs in last 24 hours: Temp:  [98.3 F (36.8 C)] 98.3 F (36.8 C) (01/09 0610) Pulse Rate:  [68] 68 (01/09 0610) Resp:  [18] 18 (01/09 0610) BP: (123)/(88) 123/88 (01/09 0610) SpO2:  [95 %] 95 % (01/09 0610) Weight:  [80.3 kg] 80.3 kg (01/08 1228)  Labs:   Estimated body mass index is 25.4 kg/m as calculated from the following:   Height as of this encounter: 5' 10 (1.778 m).   Weight as of this encounter: 80.3 kg.   Imaging  Review Plain radiographs demonstrate severe degenerative joint disease of the left knee(s). The overall alignment ismild varus. The bone quality appears to be excellent for age and reported activity level.      Assessment/Plan:  End stage arthritis, left knee   The patient history, physical examination, clinical judgment of the provider and imaging studies are consistent with end stage degenerative joint disease of the left knee(s) and total knee arthroplasty is deemed medically necessary. The treatment options including medical management, injection therapy arthroscopy and arthroplasty were discussed at length. The risks and benefits of total knee arthroplasty were presented and reviewed. The risks due to aseptic loosening, infection, stiffness, patella tracking problems, thromboembolic complications and  other imponderables were discussed. The patient acknowledged the explanation, agreed to proceed with the plan and consent was signed. Patient is being admitted for inpatient treatment for surgery, pain control, PT, OT, prophylactic antibiotics, VTE prophylaxis, progressive ambulation and ADL's and discharge planning. The patient is planning to be discharged home with home health services     Patient's anticipated LOS is less than 2 midnights, meeting these requirements: - Younger than 47 - Lives within 1 hour of care - Has a competent adult at home to recover with post-op recover - NO history of  - Chronic pain requiring opiods  - Diabetes  - Coronary Artery Disease  - Heart failure  - Heart attack  - Stroke  - DVT/VTE  - Cardiac arrhythmia  - Respiratory Failure/COPD  - Renal failure  - Anemia  - Advanced Liver disease

## 2023-09-20 ENCOUNTER — Other Ambulatory Visit (HOSPITAL_COMMUNITY): Payer: Self-pay

## 2023-09-20 ENCOUNTER — Encounter (HOSPITAL_COMMUNITY): Payer: Self-pay | Admitting: Orthopedic Surgery

## 2023-09-20 DIAGNOSIS — M1712 Unilateral primary osteoarthritis, left knee: Secondary | ICD-10-CM | POA: Diagnosis not present

## 2023-09-20 DIAGNOSIS — M170 Bilateral primary osteoarthritis of knee: Secondary | ICD-10-CM | POA: Diagnosis not present

## 2023-09-20 DIAGNOSIS — Z96652 Presence of left artificial knee joint: Secondary | ICD-10-CM | POA: Diagnosis not present

## 2023-09-20 DIAGNOSIS — Z96651 Presence of right artificial knee joint: Secondary | ICD-10-CM | POA: Diagnosis not present

## 2023-09-20 MED ORDER — ACETAMINOPHEN 325 MG PO TABS
325.0000 mg | ORAL_TABLET | Freq: Four times a day (QID) | ORAL | 0 refills | Status: AC | PRN
  Filled 2023-09-20: qty 45, 6d supply, fill #0

## 2023-09-20 MED ORDER — DOCUSATE SODIUM 100 MG PO CAPS
100.0000 mg | ORAL_CAPSULE | Freq: Two times a day (BID) | ORAL | 0 refills | Status: DC
  Filled 2023-09-20: qty 10, 5d supply, fill #0

## 2023-09-20 MED ORDER — OXYCODONE HCL 5 MG PO TABS
5.0000 mg | ORAL_TABLET | ORAL | 0 refills | Status: DC | PRN
  Filled 2023-09-20: qty 30, 3d supply, fill #0

## 2023-09-20 MED ORDER — CELECOXIB 100 MG PO CAPS
100.0000 mg | ORAL_CAPSULE | Freq: Two times a day (BID) | ORAL | 0 refills | Status: AC
  Filled 2023-09-20: qty 30, 15d supply, fill #0

## 2023-09-20 MED ORDER — GABAPENTIN 300 MG PO CAPS
600.0000 mg | ORAL_CAPSULE | Freq: Two times a day (BID) | ORAL | 0 refills | Status: AC
  Filled 2023-09-20: qty 60, 15d supply, fill #0

## 2023-09-20 MED ORDER — ASPIRIN 81 MG PO CHEW
81.0000 mg | CHEWABLE_TABLET | Freq: Two times a day (BID) | ORAL | 0 refills | Status: AC
  Filled 2023-09-20: qty 60, 30d supply, fill #0

## 2023-09-20 MED ORDER — METHOCARBAMOL 500 MG PO TABS
500.0000 mg | ORAL_TABLET | Freq: Four times a day (QID) | ORAL | 0 refills | Status: DC | PRN
  Filled 2023-09-20: qty 30, 8d supply, fill #0

## 2023-09-20 NOTE — Anesthesia Postprocedure Evaluation (Signed)
 Anesthesia Post Note  Patient: Christopher Ross  Procedure(s) Performed: LEFT TOTAL KNEE ARTHROPLASTY (Left: Knee)     Patient location during evaluation: PACU Anesthesia Type: Regional and Spinal Level of consciousness: oriented and awake and alert Pain management: pain level controlled Vital Signs Assessment: post-procedure vital signs reviewed and stable Respiratory status: spontaneous breathing, respiratory function stable and patient connected to nasal cannula oxygen Cardiovascular status: blood pressure returned to baseline and stable Postop Assessment: no headache, no backache and no apparent nausea or vomiting Anesthetic complications: no  No notable events documented.  Last Vitals:  Vitals:   09/20/23 0526 09/20/23 0909  BP: (!) 98/59 108/71  Pulse: 66 (!) 57  Resp:  18  Temp:  (!) 36.4 C  SpO2:  97%    Last Pain:  Vitals:   09/20/23 1000  TempSrc:   PainSc: 3                  Jemila Camille L [facility]

## 2023-09-20 NOTE — Progress Notes (Signed)
 Physical Therapy Treatment Patient Details Name: Christopher Ross MRN: [id number] DOB: 1955/11/29 Today's Date: 09/20/2023   History of Present Illness Pt is a 68 y.o. male who presented 09/19/23 for elective L TKA. PMH: ADHD, anxiety, HLD, OA of knee    PT Comments  The pt is demonstrating gradual progress with L quadriceps activation and thus L knee stability with standing mobility. Focused session on stair training as this was difficult for the pt earlier today. He was able to safely navigate up/down x3 stairs sideways and then x3 stairs forward with bil hands on x1 rail for support without LOB, CGA for safety only. He still needs minA for balance and safety when attempting to navigate stairs without both hands on a handrail. Educated pt on his risk for falls and safety concerns. He verbalized understanding. Provided pt with handout educating pt on sequencing of feet and safety with navigating stairs with rail or RW. Will continue to follow acutely.     If plan is discharge home, recommend the following: A little help with walking and/or transfers;A little help with bathing/dressing/bathroom;Assistance with cooking/housework;Assist for transportation;Help with stairs or ramp for entrance   Can travel by private vehicle        Equipment Recommendations  Rolling walker (2 wheels)    Recommendations for Other Services       Precautions / Restrictions Precautions Precautions: Fall Restrictions Weight Bearing Restrictions Per Provider Order: Yes LLE Weight Bearing Per Provider Order: Weight bearing as tolerated     Mobility  Bed Mobility    General bed mobility comments: Pt standing in room upon arrival and insisting on standing in room upon end of session. RN ok with it.    Transfers                   General transfer comment: Pt standing in room upon arrival    Ambulation/Gait Ambulation/Gait assistance: Contact guard assist, Supervision Gait Distance (Feet): 220  Feet Assistive device: Rolling walker (2 wheels) Gait Pattern/deviations: Step-through pattern, Decreased step length - right, Decreased stance time - left, Decreased stride length, Antalgic, Knees buckling Gait velocity: reduced Gait velocity interpretation: 1.31 - 2.62 ft/sec, indicative of limited community ambulator   General Gait Details: Pt ambulated without KI this date. Noted continued mild L knee instability still, but gradually improving. VCs provided to concentrate on activating his L quads during L stance phase to improve L knee stability and overall safety. Pt demonstrating good heel-to-toe sequencing when stepping. Step-through gait pattern but noted still mildly decreased L stance time and thus R step length. CGA-supervision for safety   Stairs Stairs: Yes Stairs assistance: Min assist, Contact guard assist Stair Management: One rail Left, Step to pattern, Forwards, Sideways, One rail Right, Backwards Number of Stairs: 8 General stair comments: Pt ascending and descending the first x3 stairs sideways with bil UE support on L rail ascending, R rail descending. No LOB, CGA for safety. Progressed to ascending and descending x3 stairs forward with bil hands on L rail ascending and R rail descending, step-to pattern, no LOB, CGA for safety. Progressed then to ascending x1 stair with only L UE support on L rail, CGA, descended backwards with bil UE support, minA for balance. Then progressed to ascending forward x1 stair and backwards x1 with bil HHA, minA for balance. Pt verbally recalling and demonstrating recall of cues for sequencing feet and to extend L knee during stance phase.   Wheelchair Mobility     Tilt Bed  Modified Rankin (Stroke Patients Only)       Balance Overall balance assessment: Needs assistance Sitting-balance support: No upper extremity supported, Feet supported Sitting balance-Leahy Scale: Good     Standing balance support: Bilateral upper extremity  supported, During functional activity, Reliant on assistive device for balance Standing balance-Leahy Scale: Poor Standing balance comment: Reliant on RW                            Cognition Arousal: Alert Behavior During Therapy: Impulsive Overall Cognitive Status: No family/caregiver present to determine baseline cognitive functioning                                 General Comments: Pt can be a bit impulsive with decreased safety awareness. He reports this is his baseline        Exercises     General Comments General comments (skin integrity, edema, etc.): provided pt with handout educating pt on safe stair negotiation with RW or rails and proper sequencing of feet on stairs      Pertinent Vitals/Pain Pain Assessment Pain Assessment: Faces Faces Pain Scale: Hurts little more Pain Location: L knee Pain Descriptors / Indicators: Discomfort, Grimacing, Operative site guarding Pain Intervention(s): Monitored during session, Limited activity within patient's tolerance, Repositioned, Other (comment) (pt reports RN is getting him pain meds)    Home Living                          Prior Function            PT Goals (current goals can now be found in the care plan section) Acute Rehab PT Goals Patient Stated Goal: to get more therapy before going home PT Goal Formulation: With patient Time For Goal Achievement: 09/26/23 Potential to Achieve Goals: Good Progress towards PT goals: Progressing toward goals    Frequency    7X/week      PT Plan      Co-evaluation              AM-PAC PT 6 Clicks Mobility   Outcome Measure  Help needed turning from your back to your side while in a flat bed without using bedrails?: None Help needed moving from lying on your back to sitting on the side of a flat bed without using bedrails?: None Help needed moving to and from a bed to a chair (including a wheelchair)?: A Little Help needed  standing up from a chair using your arms (e.g., wheelchair or bedside chair)?: A Little Help needed to walk in hospital room?: A Little Help needed climbing 3-5 steps with a railing? : A Little 6 Click Score: 20    End of Session Equipment Utilized During Treatment: Gait belt Activity Tolerance: Patient tolerated treatment well Patient left: with call bell/phone within reach;Other (comment) (standing in room per pt request and RN ok with it as RN was allowing pt up OOB on his own anyway) Nurse Communication: Mobility status;Other (comment) (pt standing in room - RN ok with it) PT Visit Diagnosis: Unsteadiness on feet (R26.81);Other abnormalities of gait and mobility (R26.89);Muscle weakness (generalized) (M62.81);Pain Pain - Right/Left: Left Pain - part of body: Knee     Time: 1141-1152 PT Time Calculation (min) (ACUTE ONLY): 11 min  Charges:    $Gait Training: 8-22 mins PT General Charges $$ ACUTE PT VISIT: 1  Visit                     Theo Ferretti, PT, DPT Acute Rehabilitation Services  Office: [phone]    Theo CHRISTELLA Ferretti 09/20/2023, 12:21 PM

## 2023-09-20 NOTE — Progress Notes (Signed)
 Physical Therapy Treatment Patient Details Name: Christopher Ross MRN: [id number] DOB: 1956-07-11 Today's Date: 09/20/2023   History of Present Illness Pt is a 68 y.o. male who presented 09/19/23 for elective L TKA. PMH: ADHD, anxiety, HLD, OA of knee    PT Comments  The pt continued to display L quadriceps weakness, noted by his L knee instability when ambulating and by his L knee buckling when trying to navigate stairs forward. He needed maxA to recover his balance during his knee buckling episode on the stairs. Cued pt to ascend/descend stairs sideways to allow for him to place and support himself with his bil hands on the x1 handrail instead for improved pt safety. Success noted, but pt needed modA for balance and safety to navigate x3 stairs sideways. Educated pt on his risk for falls and need for support on stairs at this time. He verbalized understanding. Finished session with reviewing and performing exercises off his HEP handout. Placed pt on CPM at end of session. Will continue to follow acutely.  Of note, pt continues to display deficits in attention and safety awareness, but he denied any cognitive changes from his baseline. He reports he laid on the floor, not the bed, to perform some exercises last night around 1:00 AM. He denied falling. RN notified.     If plan is discharge home, recommend the following: A little help with walking and/or transfers;A little help with bathing/dressing/bathroom;Assistance with cooking/housework;Assist for transportation;Help with stairs or ramp for entrance   Can travel by private vehicle        Equipment Recommendations  Rolling walker (2 wheels)    Recommendations for Other Services       Precautions / Restrictions Precautions Precautions: Fall Restrictions Weight Bearing Restrictions Per Provider Order: Yes LLE Weight Bearing Per Provider Order: Weight bearing as tolerated     Mobility  Bed Mobility Overal bed mobility: Modified  Independent Bed Mobility: Sit to Supine       Sit to supine: HOB elevated, Modified independent (Device/Increase time)   General bed mobility comments: No assistance needed, HOB elevated    Transfers                   General transfer comment: Pt standing in room upon arrival    Ambulation/Gait Ambulation/Gait assistance: Contact guard assist Gait Distance (Feet): 300 Feet Assistive device: Rolling walker (2 wheels) Gait Pattern/deviations: Step-through pattern, Decreased step length - right, Decreased stance time - left, Decreased stride length, Antalgic, Knees buckling Gait velocity: reduced Gait velocity interpretation: 1.31 - 2.62 ft/sec, indicative of limited community ambulator   General Gait Details: Pt ambulated without KI this date. Noted mild L knee instability still. VCs provided to concentrate on activating his L quads during L stance phase to improve L knee stability and overall safety. Pt demonstrating good heel-to-toe sequencing when stepping. Step-through gait pattern but noted still mildly decreased L stance time and thus R step length. CGA for safety   Stairs Stairs: Yes Stairs assistance: Mod assist, Max assist Stair Management: One rail Left, Step to pattern, Forwards, Sideways Number of Stairs: 3 General stair comments: Initially attempted to ascend forward on stairs with L hand on rail to simulate home set-up. However, upon first attempt to step up with his R leg his L knee buckled and he needed maxA to recover his balance. Cued pt to change techniques to ascending sideways to allow him bil UE support on the rail and thus improve his safety. ModA provided  throughout to ascend and descend sideways x3 rails with bil UE support on L rail ascending, R rail descending. Cues provided to extend L knee when standing on it to move R leg, pt needing to focus to be successful.   Wheelchair Mobility     Tilt Bed    Modified Rankin (Stroke Patients Only)        Balance Overall balance assessment: Needs assistance Sitting-balance support: No upper extremity supported, Feet supported Sitting balance-Leahy Scale: Good     Standing balance support: Bilateral upper extremity supported, During functional activity, Reliant on assistive device for balance Standing balance-Leahy Scale: Poor Standing balance comment: Reliant on RW, x1 LOB when L knee buckled with first attempt at stairs and pt needed maxA to recover                            Cognition Arousal: Alert Behavior During Therapy: Impulsive Overall Cognitive Status: No family/caregiver present to determine baseline cognitive functioning                                 General Comments: Pt can be a bit impulsive with decreased safety awareness. Pt reporting he laid on the floor of the room, not the bed, to perform exercises at 1 AM last night. He denied any falls though. Pt needing repeated cues to remain focused on extending his L knee to ensure safety with gait and stairs. Pt reports this is his baseline, even when asked directly about the concerns noted.        Exercises Total Joint Exercises Ankle Circles/Pumps: AROM, Left, Other reps (comment), Supine (x3) Quad Sets: AROM, Strengthening, Left, Other reps (comment), Supine (x3) Towel Squeeze: AROM, Strengthening, Both, Other reps (comment), Supine (x3) Short Arc Quad: AROM, Strengthening, Left, Other reps (comment), Supine (x3) Heel Slides: AROM, Left, Other reps (comment), Supine (x3) Hip ABduction/ADduction: AROM, Strengthening, Left, Other reps (comment), Supine (x3) Straight Leg Raises: AROM, Strengthening, Left, Other reps (comment), Supine (x3) Long Arc Quad: AROM, Strengthening, Left, Other reps (comment), Seated (x3) Knee Flexion: AROM, Left, Other reps (comment), Seated (x3 AROM then x3 with AAROM from his R leg hooking anteriorly on his L to assist it)    General Comments General comments  (skin integrity, edema, etc.): reviewed exercises on HEP handout provided      Pertinent Vitals/Pain Pain Assessment Pain Assessment: Faces Faces Pain Scale: Hurts a little bit Pain Location: L knee Pain Descriptors / Indicators: Discomfort, Grimacing, Operative site guarding Pain Intervention(s): Monitored during session, Limited activity within patient's tolerance, Repositioned    Home Living                          Prior Function            PT Goals (current goals can now be found in the care plan section) Acute Rehab PT Goals Patient Stated Goal: to get more therapy before going home PT Goal Formulation: With patient Time For Goal Achievement: 09/26/23 Potential to Achieve Goals: Good Progress towards PT goals: Progressing toward goals    Frequency    7X/week      PT Plan      Co-evaluation              AM-PAC PT 6 Clicks Mobility   Outcome Measure  Help needed turning from your back to  your side while in a flat bed without using bedrails?: None Help needed moving from lying on your back to sitting on the side of a flat bed without using bedrails?: None Help needed moving to and from a bed to a chair (including a wheelchair)?: A Little Help needed standing up from a chair using your arms (e.g., wheelchair or bedside chair)?: A Little Help needed to walk in hospital room?: A Little Help needed climbing 3-5 steps with a railing? : A Lot 6 Click Score: 19    End of Session Equipment Utilized During Treatment: Gait belt Activity Tolerance: Patient tolerated treatment well Patient left: in bed;with call bell/phone within reach;with bed alarm set Nurse Communication: Mobility status;Other (comment) (pt impulsive and a fall risk, recommending RN keep pt on bed/chair alarm while here) PT Visit Diagnosis: Unsteadiness on feet (R26.81);Other abnormalities of gait and mobility (R26.89);Muscle weakness (generalized) (M62.81);Pain Pain - Right/Left:  Left Pain - part of body: Knee     Time: 9163-9095 PT Time Calculation (min) (ACUTE ONLY): 28 min  Charges:    $Gait Training: 8-22 mins $Therapeutic Exercise: 8-22 mins PT General Charges $$ ACUTE PT VISIT: 1 Visit                     Theo Ferretti, PT, DPT Acute Rehabilitation Services  Office: [phone]    Theo CHRISTELLA Ferretti 09/20/2023, 12:09 PM

## 2023-09-20 NOTE — Progress Notes (Signed)
 AVS Reviewed with patient.  Medications returned to patient.  Patient VERY anxious for discharge. Medications picked up from TOC.

## 2023-09-20 NOTE — Progress Notes (Addendum)
  Subjective: Patient stable.  Pain controlled.   Objective: Vital signs in last 24 hours: Temp:  [96.9 F (36.1 C)-98.2 F (36.8 C)] 98.2 F (36.8 C) (01/10 0435) Pulse Rate:  [51-73] 66 (01/10 0526) Resp:  [10-21] 20 (01/10 0435) BP: (88-110)/(50-77) 98/59 (01/10 0526) SpO2:  [92 %-98 %] 93 % (01/10 0435)  Intake/Output from previous day: 01/09 0701 - 01/10 0700 In: 1300 [I.V.:600; IV Piggyback:700] Out: 225 [Urine:150; Blood:75] Intake/Output this shift: No intake/output data recorded.  Exam:  Sensation intact distally Intact pulses distally Dorsiflexion/Plantar flexion intact  Labs: Recent Labs    09/19/23 0658  HGB 14.7   Recent Labs    09/19/23 0658  WBC 8.2  RBC 4.76  HCT 43.3  PLT 198   Recent Labs    09/19/23 0658  NA 137  K 3.9  CL 105  CO2 22  BUN 15  CREATININE 1.01  GLUCOSE 91  CALCIUM 9.1   No results for input(s): LABPT, INR in the last 72 hours.  Assessment/Plan: Patient is doing well.  He is able to do a straight leg raise this morning.  Anticipate discharge early this afternoon after he works with therapy.  He should use the knee immobilizer at least for the first 3 to 4 days until quad strength more fully recovers although he is looking very good this morning in that regard.  No home health PT for this patient.  He wants to do physical therapy on his own which I think should be fine based on his results with his right knee.   G Scott Ayari Liwanag 09/20/2023, 7:40 AM

## 2023-09-20 NOTE — TOC Transition Note (Addendum)
 Transition of Care [facility]) - Discharge Note   Patient Details  Name: Christopher Ross MRN: [id number] Date of Birth: Oct 18, 1955  [facility] CM/SW Contact:  Rosalva Jon Bloch, RN Phone Number: 09/20/2023, 9:54 AM   Clinical Narrative:    Patient will DC to: home Anticipated DC date: 09/20/2023 Family notified: yes Transport by: car  Per MD patient ready for DC today pending therapy clearance. RN, patient, and patient's wife aware of DC.   Pt without RX med concerns or transportation issues. [facility] with [employer] [phone]) will deliver CPM to pt's home once d/c.  RW will be delivered to bedside by [facility] [phone]) prior to d/c. Post hospital f/u noted on AVS. RNCM will sign off for now as intervention is no longer needed. Please consult us  again if new needs arise.    Final next level of care: Home/Self Care (declined [facility] services) Barriers to Discharge: No Barriers Identified   Patient Goals and CMS Choice            Discharge Placement                       Discharge Plan and Services Additional resources added to the After Visit Summary for                                       Social Drivers of Health (SDOH) Interventions SDOH Screenings   Food Insecurity: No Food Insecurity (09/20/2023)  Housing: Low Risk  (09/20/2023)  Transportation Needs: No Transportation Needs (09/20/2023)  Utilities: Not At Risk (09/20/2023)  Social Connections: Moderately Isolated (09/20/2023)  Tobacco Use: Low Risk  (09/19/2023)     Readmission Risk Interventions     No data to display

## 2023-09-20 NOTE — Care Management Obs Status (Signed)
 MEDICARE OBSERVATION STATUS NOTIFICATION   Patient Details  Name: Christopher Ross MRN: [id number] Date of Birth: [birth year]   Medicare Observation Status Notification Given:  Yes    Epifanio Lesches, RN 09/20/2023, 12:32 PM

## 2023-09-25 ENCOUNTER — Telehealth: Payer: Self-pay | Admitting: *Deleted

## 2023-09-25 ENCOUNTER — Other Ambulatory Visit: Payer: Self-pay | Admitting: Surgical

## 2023-09-25 MED ORDER — OXYCODONE HCL 5 MG PO TABS: 5.0000 mg | ORAL_TABLET | ORAL | 0 refills | Status: AC | PRN

## 2023-09-25 MED ORDER — METHOCARBAMOL 500 MG PO TABS: 500.0000 mg | ORAL_TABLET | Freq: Four times a day (QID) | ORAL | 0 refills | Status: AC | PRN

## 2023-09-25 MED ORDER — DOCUSATE SODIUM 100 MG PO CAPS: 100.0000 mg | ORAL_CAPSULE | Freq: Two times a day (BID) | ORAL | 0 refills | Status: AC

## 2023-09-25 NOTE — Telephone Encounter (Signed)
 Spoke to patient today and he would like a refill of his pain medication, muscle relaxer and Colace please. Send to [facility]. Thank you.

## 2023-09-25 NOTE — Telephone Encounter (Signed)
 Sent in, thanks sheri

## 2023-09-26 NOTE — Discharge Summary (Signed)
Physician Discharge Summary      Patient ID: Christopher Ross MRN: [id number] DOB/AGE: [birth year] 70 y.o.  Admit date: 09/19/2023 Discharge date: 09/20/2023  Admission Diagnoses:  Principal Problem:   OA (osteoarthritis) of knee Active Problems:   Arthritis of left knee   S/P total knee replacement, left   Discharge Diagnoses:  Same  Surgeries: Procedure(s): LEFT TOTAL KNEE ARTHROPLASTY on 09/19/2023   Consultants:   Discharged Condition: Stable  Hospital Course: Christopher Ross is an 68 y.o. male who was admitted 09/19/2023 with a chief complaint of left knee pain, and found to have a diagnosis of left knee osteoarthritis.  They were brought to the operating room on 09/19/2023 and underwent the above named procedures.  Pt awoke from anesthesia without complication and was transferred to the floor. On POD1, patient's pain was controlled.  He was able to mobilize well with physical therapy.  No red flag signs or symptoms throughout his stay.  He was discharged home on POD 1..  Pt will f/u with Dr. August Saucer in clinic in ~2 weeks.   Antibiotics given:  Anti-infectives (From admission, onward)    Start     Dose/Rate Route Frequency Ordered Stop   09/19/23 1530  ceFAZolin (ANCEF) IVPB 2g/100 mL premix        2 g 200 mL/hr over 30 Minutes Intravenous Every 8 hours 09/19/23 1312 09/20/23 1030   09/19/23 0821  vancomycin (VANCOCIN) powder  Status:  Discontinued          As needed 09/19/23 0821 09/19/23 1030   09/19/23 0600  ceFAZolin (ANCEF) IVPB 2g/100 mL premix        2 g 200 mL/hr over 30 Minutes Intravenous On call to O.R. 09/19/23 0556 09/19/23 0801   09/19/23 0600  ceFAZolin (ANCEF) 2-4 GM/100ML-% IVPB       Note to Pharmacy: Isabel Caprice, Destiny: cabinet override      09/19/23 0600 09/19/23 0737     .  Recent vital signs:  Vitals:   09/20/23 0526 09/20/23 0909  BP: (!) 98/59 108/71  Pulse: 66 (!) 57  Resp:  18  Temp:  (!) 97.5 F (36.4 C)  SpO2:  97%    Recent laboratory  studies:  Results for orders placed or performed during the hospital encounter of 09/19/23  Surgical pcr screen   Collection Time: 09/19/23  5:56 AM   Specimen: Nasal Mucosa; Nasal Swab  Result Value Ref Range   MRSA, PCR NEGATIVE NEGATIVE   Staphylococcus aureus NEGATIVE NEGATIVE  CBC   Collection Time: 09/19/23  6:58 AM  Result Value Ref Range   WBC 8.2 4.0 - 10.5 K/uL   RBC 4.76 4.22 - 5.81 MIL/uL   Hemoglobin 14.7 13.0 - 17.0 g/dL   HCT 40.9 81.1 - 91.4 %   MCV 91.0 80.0 - 100.0 fL   MCH 30.9 26.0 - 34.0 pg   MCHC 33.9 30.0 - 36.0 g/dL   RDW 78.2 95.6 - 21.3 %   Platelets 198 150 - 400 K/uL   nRBC 0.0 0.0 - 0.2 %  Basic metabolic panel   Collection Time: 09/19/23  6:58 AM  Result Value Ref Range   Sodium 137 135 - 145 mmol/L   Potassium 3.9 3.5 - 5.1 mmol/L   Chloride 105 98 - 111 mmol/L   CO2 22 22 - 32 mmol/L   Glucose, Bld 91 70 - 99 mg/dL   BUN 15 8 - 23 mg/dL   Creatinine, Ser 0.86 0.61 - 1.24  mg/dL   Calcium 9.1 8.9 - 86.5 mg/dL   GFR, Estimated >78 >46 mL/min   Anion gap 10 5 - 15    Discharge Medications:   Allergies as of 09/20/2023   No Known Allergies      Medication List     STOP taking these medications    gabapentin 600 MG tablet Commonly known as: NEURONTIN Replaced by: gabapentin 300 MG capsule       TAKE these medications    acetaminophen 325 MG tablet Commonly known as: TYLENOL Take 1-2 tablets (325-650 mg total) by mouth every 6 (six) hours as needed for mild pain (pain score 1-3) (or temp > 100.5).   Aspirin Low Dose 81 MG chewable tablet Generic drug: aspirin Chew 1 tablet (81 mg total) by mouth 2 (two) times daily.   celecoxib 100 MG capsule Commonly known as: CELEBREX Take 1 capsule (100 mg total) by mouth 2 (two) times daily.   gabapentin 300 MG capsule Commonly known as: NEURONTIN Take 2 capsules (600 mg total) by mouth 2 (two) times daily. Replaces: gabapentin 600 MG tablet   simvastatin 10 MG tablet Commonly known  as: ZOCOR Take 10 mg by mouth daily at 6 PM.   zolpidem 10 MG tablet Commonly known as: AMBIEN Take 8 mg by mouth at bedtime. Break tablet        Diagnostic Studies: No results found.  Disposition: Discharge disposition: 01-Home or Self Care       Discharge Instructions     Call MD / Call 911   Complete by: As directed    If you experience chest pain or shortness of breath, CALL 911 and be transported to the hospital emergency room.  If you develope a fever above 101 F, pus (white drainage) or increased drainage or redness at the wound, or calf pain, call your surgeon's office.   Constipation Prevention   Complete by: As directed    Drink plenty of fluids.  Prune juice may be helpful.  You may use a stool softener, such as Colace (over the counter) 100 mg twice a day.  Use MiraLax (over the counter) for constipation as needed.   Diet - low sodium heart healthy   Complete by: As directed    Discharge instructions   Complete by: As directed    From Dr. August Saucer 1.  CPM machine 1 hour 3 times a day minimum 2.  Weightbearing as tolerated with walker 3.  Okay to shower dressing is waterproof 4.  Follow-up in 2 weeks   Increase activity slowly as tolerated   Complete by: As directed    Post-operative opioid taper instructions:   Complete by: As directed    POST-OPERATIVE OPIOID TAPER INSTRUCTIONS: It is important to wean off of your opioid medication as soon as possible. If you do not need pain medication after your surgery it is ok to stop day one. Opioids include: Codeine, Hydrocodone(Norco, Vicodin), Oxycodone(Percocet, oxycontin) and hydromorphone amongst others.  Long term and even short term use of opiods can cause: Increased pain response Dependence Constipation Depression Respiratory depression And more.  Withdrawal symptoms can include Flu like symptoms Nausea, vomiting And more Techniques to manage these symptoms Hydrate well Eat regular healthy meals Stay  active Use relaxation techniques(deep breathing, meditating, yoga) Do Not substitute Alcohol to help with tapering If you have been on opioids for less than two weeks and do not have pain than it is ok to stop all together.  Plan to wean off  of opioids This plan should start within one week post op of your joint replacement. Maintain the same interval or time between taking each dose and first decrease the dose.  Cut the total daily intake of opioids by one tablet each day Next start to increase the time between doses. The last dose that should be eliminated is the evening dose.           Follow-up Information     Burdine, Ananias Pilgrim, MD Follow up.   Specialty: Family Medicine Contact information: [address] [place] [address] [phone]         [facility] Follow up.   Why: Home health PT services will be provided by [facility] information: [address] [place] [address] [phone]         [facility] Follow up.   Specialty: Orthopedics Contact information: [address] [place] [address] [phone]                 Signed: Karenann Cai 09/26/2023, 10:33 AM

## 2023-10-04 ENCOUNTER — Ambulatory Visit (INDEPENDENT_AMBULATORY_CARE_PROVIDER_SITE_OTHER): Payer: Medicare PPO | Admitting: Surgical

## 2023-10-04 ENCOUNTER — Encounter: Payer: Self-pay | Admitting: Surgical

## 2023-10-04 ENCOUNTER — Other Ambulatory Visit (INDEPENDENT_AMBULATORY_CARE_PROVIDER_SITE_OTHER): Payer: Self-pay

## 2023-10-04 DIAGNOSIS — Z96652 Presence of left artificial knee joint: Secondary | ICD-10-CM

## 2023-10-04 NOTE — Progress Notes (Signed)
Post-Op Visit Note   Patient: Christopher Ross           Date of Birth: [birth year]           MRN: [id number] Visit Date: 10/04/2023 PCP: Juliette Alcide, MD   Assessment & Plan:  Chief Complaint:  Chief Complaint  Patient presents with   Left Knee - Routine Post Op    09/19/23 left TKA   Visit Diagnoses:  1. S/P TKR (total knee replacement), left     Plan: Christopher Ross is a 68 y.o. male who presents s/p left total knee arthroplasty on 09/19/2023.  Doing well overall.  Using CPM machine up to 120 degrees.  They deny any calf pain, shortness of breath, chest pain, abdominal pain.  Pain is overall controlled and taking no more opioid for pain control.  Taking aspirin for DVT prophylaxis.  Ambulating with cane.   On exam, patient has range of motion 3 degrees to 95 degrees.  Incision is healing well without evidence of infection or dehiscence.  2+ DP pulse of the operative extremity.  No calf tenderness, negative Homans' sign.  Able to perform straight leg raise.  Intact ankle dorsiflexion.  Plan is start physical therapy at [facility] most likely.  There is a small area on the incision at the superior aspect of the incision that looks minimally red and looks more like 1-2 pimples than anything else.  This was outlined in purple marker today and he will keep an eye on it and send picture on Monday to let us know how it is looking.  Otherwise he looks like he is progressing with his range of motion without any issue.  His #1 goal is to get back to playing golf and I recommended that he hold off on any significant golf activities until his next appointment in 4 weeks so that he can achieve optimal range of motion and strength and good bony ingrowth of the implant prior to return to sport.  At next appointment, could consider progressing to putting/chipping and then pitching and then full swing activities..    Follow-Up Instructions: No follow-ups on file.   Orders:  Orders Placed  This Encounter  Procedures   XR Knee 1-2 Views Left   No orders of the defined types were placed in this encounter.   Imaging: No results found.  PMFS History: Patient Active Problem List   Diagnosis Date Noted   OA (osteoarthritis) of knee 09/19/2023   S/P total knee replacement, left 09/19/2023   Arthritis of left knee 04/01/2018   Unilateral primary osteoarthritis, right knee    Prostatic hyperplasia 02/28/2018   Past Medical History:  Diagnosis Date   ADHD (attention deficit hyperactivity disorder)    no meds, no current problem per pt on 09/18/23   Anxiety    Dog bite 02/27/2018   RIGHT FINGER LEFT HAND SMALL BITE AREA CLEANED WITH ALCOHOL AND BANDAID APPLIED    Enlarged prostate    Hyperlipidemia    Inguinal hernia    OA (osteoarthritis) of knee    Right   Wears glasses     No family history on file.  Past Surgical History:  Procedure Laterality Date   ANTERIOR CERVICAL DECOMP/DISCECTOMY FUSION  07/30/2012   Procedure: ANTERIOR CERVICAL DECOMPRESSION/DISCECTOMY FUSION 2 LEVELS;  Surgeon: Cristi Loron, MD;  Location: [facility];  Service: Neurosurgery;  Laterality: N/A;  Cervical four-five,Cervical five-six anterior cervical decompression with fusion interbody prothesis plating and bonegraft  COLONOSCOPY  2015   and in 2020 - in [place]   FRACTURE SURGERY  YRS AGO   broken left arm, repair   INGUINAL HERNIA REPAIR Right 09/12/2017   Procedure: LAPAROSCOPIC RIGHT INGUINAL HERNIA REPAIR WITH MESH;  Surgeon: Abigail Miyamoto, MD;  Location: [facility];  Service: General;  Laterality: Right;   INSERTION OF MESH Right 09/12/2017   Procedure: INSERTION OF MESH;  Surgeon: Abigail Miyamoto, MD;  Location: [facility];  Service: General;  Laterality: Right;   TOTAL KNEE ARTHROPLASTY Right 04/01/2018   Procedure: RIGHT TOTAL KNEE ARTHROPLASTY;  Surgeon: Cammy Copa, MD;  Location: [facility] OR;  Service: Orthopedics;  Laterality: Right;   TOTAL KNEE ARTHROPLASTY Left 09/19/2023    Procedure: LEFT TOTAL KNEE ARTHROPLASTY;  Surgeon: Cammy Copa, MD;  Location: [facility] OR;  Service: Orthopedics;  Laterality: Left;   TRANSURETHRAL RESECTION OF PROSTATE N/A 02/28/2018   Procedure: TRANSURETHRAL RESECTION OF THE PROSTATE (TURP);  Surgeon: Sebastian Ache, MD;  Location: [facility];  Service: Urology;  Laterality: N/A;   Social History   Occupational History   Not on file  Tobacco Use   Smoking status: Never   Smokeless tobacco: Never  Vaping Use   Vaping status: Never Used  Substance and Sexual Activity   Alcohol use: Yes    Alcohol/week: 5.0 - 7.0 standard drinks of alcohol    Types: 5 - 7 Glasses of wine per week   Drug use: Never   Sexual activity: Yes

## 2023-10-07 ENCOUNTER — Encounter: Payer: Self-pay | Admitting: Orthopedic Surgery

## 2023-10-07 ENCOUNTER — Other Ambulatory Visit (INDEPENDENT_AMBULATORY_CARE_PROVIDER_SITE_OTHER): Payer: Self-pay

## 2023-10-07 ENCOUNTER — Encounter: Payer: Medicare PPO | Admitting: Orthopedic Surgery

## 2023-10-07 ENCOUNTER — Telehealth: Payer: Self-pay | Admitting: Orthopedic Surgery

## 2023-10-07 ENCOUNTER — Ambulatory Visit (INDEPENDENT_AMBULATORY_CARE_PROVIDER_SITE_OTHER): Payer: Medicare PPO | Admitting: Orthopedic Surgery

## 2023-10-07 DIAGNOSIS — Z01818 Encounter for other preprocedural examination: Secondary | ICD-10-CM

## 2023-10-07 DIAGNOSIS — Z96652 Presence of left artificial knee joint: Secondary | ICD-10-CM

## 2023-10-07 NOTE — Progress Notes (Unsigned)
Post-Op Visit Note   Patient: Christopher Ross           Date of Birth: [birth year]           MRN: [id number] Visit Date: 10/07/2023 PCP: Juliette Alcide, MD   Assessment & Plan:  Chief Complaint:  Chief Complaint  Patient presents with   Left Knee - Pain     09/19/23 left TKA  Patient had fall on 10/04/23   Visit Diagnoses:  1. S/P TKR (total knee replacement), left     Plan: Christopher Ross is a 68 year old active patient who underwent left total knee replacement 09/19/2023.  Reported a fall since his last appointment 10/04/2023.  More of a twisting injury.  Has had pain for couple of days.  Was doing very well prior to that.  On examination extensor mechanism is intact.  Lacks about 5 degrees of full extension and can flex to around 85.  Radiographs look good.  Collateral ligaments are stable.  Plan at this time is continue with rehab to focus on full extension.  4-week return.  No indications to change physical therapy regimen at this time.  Follow-Up Instructions: No follow-ups on file.   Orders:  Orders Placed This Encounter  Procedures   XR KNEE 3 VIEW LEFT   No orders of the defined types were placed in this encounter.   Imaging: No results found.  PMFS History: Patient Active Problem List   Diagnosis Date Noted   OA (osteoarthritis) of knee 09/19/2023   S/P total knee replacement, left 09/19/2023   Arthritis of left knee 04/01/2018   Unilateral primary osteoarthritis, right knee    Prostatic hyperplasia 02/28/2018   Past Medical History:  Diagnosis Date   ADHD (attention deficit hyperactivity disorder)    no meds, no current problem per pt on 09/18/23   Anxiety    Dog bite 02/27/2018   RIGHT FINGER LEFT HAND SMALL BITE AREA CLEANED WITH ALCOHOL AND BANDAID APPLIED    Enlarged prostate    Hyperlipidemia    Inguinal hernia    OA (osteoarthritis) of knee    Right   Wears glasses     History reviewed. No pertinent family history.  Past Surgical History:   Procedure Laterality Date   ANTERIOR CERVICAL DECOMP/DISCECTOMY FUSION  07/30/2012   Procedure: ANTERIOR CERVICAL DECOMPRESSION/DISCECTOMY FUSION 2 LEVELS;  Surgeon: Cristi Loron, MD;  Location: [facility];  Service: Neurosurgery;  Laterality: N/A;  Cervical four-five,Cervical five-six anterior cervical decompression with fusion interbody prothesis plating and bonegraft   COLONOSCOPY  2015   and in 2020 - in [place]   FRACTURE SURGERY  YRS AGO   broken left arm, repair   INGUINAL HERNIA REPAIR Right 09/12/2017   Procedure: LAPAROSCOPIC RIGHT INGUINAL HERNIA REPAIR WITH MESH;  Surgeon: Abigail Miyamoto, MD;  Location: [facility];  Service: General;  Laterality: Right;   INSERTION OF MESH Right 09/12/2017   Procedure: INSERTION OF MESH;  Surgeon: Abigail Miyamoto, MD;  Location: [facility];  Service: General;  Laterality: Right;   TOTAL KNEE ARTHROPLASTY Right 04/01/2018   Procedure: RIGHT TOTAL KNEE ARTHROPLASTY;  Surgeon: Cammy Copa, MD;  Location: [facility] OR;  Service: Orthopedics;  Laterality: Right;   TOTAL KNEE ARTHROPLASTY Left 09/19/2023   Procedure: LEFT TOTAL KNEE ARTHROPLASTY;  Surgeon: Cammy Copa, MD;  Location: [facility] OR;  Service: Orthopedics;  Laterality: Left;   TRANSURETHRAL RESECTION OF PROSTATE N/A 02/28/2018   Procedure: TRANSURETHRAL RESECTION OF THE PROSTATE (TURP);  Surgeon: Berneice Heinrich,  Normand Sloop, MD;  Location: [facility];  Service: Urology;  Laterality: N/A;   Social History   Occupational History   Not on file  Tobacco Use   Smoking status: Never   Smokeless tobacco: Never  Vaping Use   Vaping status: Never Used  Substance and Sexual Activity   Alcohol use: Yes    Alcohol/week: 5.0 - 7.0 standard drinks of alcohol    Types: 5 - 7 Glasses of wine per week   Drug use: Never   Sexual activity: Yes

## 2023-10-07 NOTE — Telephone Encounter (Signed)
Patient in office now to see Dr August Saucer

## 2023-10-07 NOTE — Telephone Encounter (Signed)
Patient called. He fell. Would like to get xray on the knee he had surgery on. Today if he could

## 2023-10-09 ENCOUNTER — Encounter: Payer: Self-pay | Admitting: Orthopedic Surgery

## 2023-10-09 DIAGNOSIS — Z96652 Presence of left artificial knee joint: Secondary | ICD-10-CM | POA: Diagnosis not present

## 2023-10-11 ENCOUNTER — Telehealth: Payer: Self-pay | Admitting: Orthopedic Surgery

## 2023-10-11 NOTE — Telephone Encounter (Signed)
Patient called advised he went to the gym worked out and now he can not sleep and have not slept for the last two nights. Patient asked if he can get a Rx called into his pharmacy for Temazepam 15 or 30 mg to help him sleep.  Patient said he uses [facility] in [place].  Patient asked for a call when Rx is sent to the pharmacy.  The number to contact patient is [phone]

## 2023-10-11 NOTE — Telephone Encounter (Signed)
Pt called about update of last chart note. Please call this pt about this  matter at [phone].Please call pt ASAP

## 2023-10-14 ENCOUNTER — Other Ambulatory Visit: Payer: Self-pay | Admitting: Orthopedic Surgery

## 2023-10-14 ENCOUNTER — Telehealth: Payer: Self-pay

## 2023-10-14 MED ORDER — TEMAZEPAM 15 MG PO CAPS: 15.0000 mg | ORAL_CAPSULE | Freq: Every evening | ORAL | 0 refills | Status: AC | PRN

## 2023-10-14 NOTE — Telephone Encounter (Signed)
Restoril sent

## 2023-10-14 NOTE — Telephone Encounter (Signed)
Patient calling again about needing a sleep aid. He is asking to talk with one of you. Please advise.

## 2023-10-14 NOTE — Telephone Encounter (Signed)
Dr dean spoke with him

## 2023-10-15 DIAGNOSIS — Z96652 Presence of left artificial knee joint: Secondary | ICD-10-CM | POA: Diagnosis not present

## 2023-10-17 DIAGNOSIS — Z96652 Presence of left artificial knee joint: Secondary | ICD-10-CM | POA: Diagnosis not present

## 2023-10-21 ENCOUNTER — Encounter: Payer: Medicare PPO | Admitting: Surgical

## 2023-10-25 DIAGNOSIS — R351 Nocturia: Secondary | ICD-10-CM | POA: Diagnosis not present

## 2023-10-25 DIAGNOSIS — N3281 Overactive bladder: Secondary | ICD-10-CM | POA: Diagnosis not present

## 2023-11-01 ENCOUNTER — Encounter: Payer: Medicare PPO | Admitting: Surgical

## 2024-02-20 DIAGNOSIS — N3281 Overactive bladder: Secondary | ICD-10-CM | POA: Diagnosis not present

## 2024-02-20 DIAGNOSIS — Z125 Encounter for screening for malignant neoplasm of prostate: Secondary | ICD-10-CM | POA: Diagnosis not present

## 2024-02-20 DIAGNOSIS — R351 Nocturia: Secondary | ICD-10-CM | POA: Diagnosis not present

## 2024-03-05 DIAGNOSIS — L570 Actinic keratosis: Secondary | ICD-10-CM | POA: Diagnosis not present

## 2024-03-05 DIAGNOSIS — X32XXXD Exposure to sunlight, subsequent encounter: Secondary | ICD-10-CM | POA: Diagnosis not present

## 2024-03-16 DIAGNOSIS — H524 Presbyopia: Secondary | ICD-10-CM | POA: Diagnosis not present

## 2024-03-16 DIAGNOSIS — H52223 Regular astigmatism, bilateral: Secondary | ICD-10-CM | POA: Diagnosis not present

## 2024-03-16 DIAGNOSIS — H5213 Myopia, bilateral: Secondary | ICD-10-CM | POA: Diagnosis not present

## 2024-03-26 ENCOUNTER — Other Ambulatory Visit (HOSPITAL_COMMUNITY): Payer: Self-pay

## 2024-06-04 DIAGNOSIS — X32XXXD Exposure to sunlight, subsequent encounter: Secondary | ICD-10-CM | POA: Diagnosis not present

## 2024-06-04 DIAGNOSIS — L570 Actinic keratosis: Secondary | ICD-10-CM | POA: Diagnosis not present
